# Patient Record
Sex: Male | Born: 1945 | Race: White | Hispanic: No | Marital: Married | Smoking: Former smoker
Health system: Southern US, Community
[De-identification: ages and names within clinical notes are randomized; demographics above are authoritative.]

## PROBLEM LIST (undated history)

## (undated) DIAGNOSIS — C801 Malignant (primary) neoplasm, unspecified: Secondary | ICD-10-CM

## (undated) HISTORY — PX: KNEE SURGERY: SHX244

## (undated) HISTORY — PX: LUNG REMOVAL, PARTIAL: SHX233

## (undated) HISTORY — PX: CHOLECYSTECTOMY: SHX55

---

## 2006-06-05 ENCOUNTER — Ambulatory Visit: Payer: Self-pay | Admitting: Gastroenterology

## 2008-11-02 ENCOUNTER — Ambulatory Visit: Payer: Self-pay | Admitting: Family Medicine

## 2008-11-05 ENCOUNTER — Ambulatory Visit: Payer: Self-pay | Admitting: Oncology

## 2008-11-10 ENCOUNTER — Ambulatory Visit: Payer: Self-pay | Admitting: Oncology

## 2008-11-23 ENCOUNTER — Ambulatory Visit: Payer: Self-pay | Admitting: Oncology

## 2008-11-25 ENCOUNTER — Ambulatory Visit: Payer: Self-pay | Admitting: Oncology

## 2008-12-06 ENCOUNTER — Ambulatory Visit: Payer: Self-pay | Admitting: General Surgery

## 2008-12-08 ENCOUNTER — Inpatient Hospital Stay: Payer: Self-pay | Admitting: General Surgery

## 2008-12-24 ENCOUNTER — Ambulatory Visit: Payer: Self-pay | Admitting: General Surgery

## 2008-12-27 ENCOUNTER — Ambulatory Visit: Payer: Self-pay | Admitting: General Surgery

## 2009-01-11 ENCOUNTER — Ambulatory Visit: Payer: Self-pay | Admitting: General Surgery

## 2009-01-14 ENCOUNTER — Ambulatory Visit: Payer: Self-pay | Admitting: General Surgery

## 2009-01-19 ENCOUNTER — Inpatient Hospital Stay: Payer: Self-pay | Admitting: General Surgery

## 2009-01-26 ENCOUNTER — Ambulatory Visit: Payer: Self-pay | Admitting: Oncology

## 2009-02-08 ENCOUNTER — Ambulatory Visit: Payer: Self-pay | Admitting: General Surgery

## 2009-02-11 ENCOUNTER — Ambulatory Visit: Payer: Self-pay | Admitting: Oncology

## 2009-02-25 ENCOUNTER — Ambulatory Visit: Payer: Self-pay | Admitting: Oncology

## 2009-03-04 ENCOUNTER — Ambulatory Visit: Payer: Self-pay | Admitting: General Surgery

## 2009-05-28 ENCOUNTER — Ambulatory Visit: Payer: Self-pay | Admitting: Oncology

## 2009-06-01 ENCOUNTER — Ambulatory Visit: Payer: Self-pay | Admitting: Oncology

## 2009-06-03 ENCOUNTER — Ambulatory Visit: Payer: Self-pay | Admitting: Oncology

## 2009-06-28 ENCOUNTER — Ambulatory Visit: Payer: Self-pay | Admitting: Oncology

## 2009-08-26 ENCOUNTER — Ambulatory Visit: Payer: Self-pay | Admitting: Oncology

## 2009-08-30 ENCOUNTER — Ambulatory Visit: Payer: Self-pay | Admitting: Oncology

## 2009-09-02 ENCOUNTER — Ambulatory Visit: Payer: Self-pay | Admitting: Oncology

## 2009-09-25 ENCOUNTER — Ambulatory Visit: Payer: Self-pay | Admitting: Oncology

## 2009-11-25 ENCOUNTER — Ambulatory Visit: Payer: Self-pay | Admitting: Oncology

## 2009-12-06 ENCOUNTER — Ambulatory Visit: Payer: Self-pay | Admitting: Oncology

## 2009-12-16 ENCOUNTER — Ambulatory Visit: Payer: Self-pay | Admitting: Oncology

## 2009-12-26 ENCOUNTER — Ambulatory Visit: Payer: Self-pay | Admitting: Oncology

## 2010-06-15 ENCOUNTER — Ambulatory Visit: Payer: Self-pay | Admitting: Oncology

## 2010-06-23 ENCOUNTER — Ambulatory Visit: Payer: Self-pay | Admitting: Oncology

## 2010-06-28 ENCOUNTER — Ambulatory Visit: Payer: Self-pay | Admitting: Oncology

## 2010-12-12 ENCOUNTER — Ambulatory Visit: Payer: Self-pay | Admitting: Oncology

## 2010-12-27 ENCOUNTER — Ambulatory Visit: Payer: Self-pay | Admitting: Oncology

## 2011-01-22 IMAGING — CT NM PET TUM IMG LTD AREA
1 of 5 series · 8 of 25 positions shown · non-contrast
Comparison: none

REASON FOR EXAM: lung nodule seen on ct
COMMENTS:

PROCEDURE:     PET - PET/CT DX LUNG CA  - November 10, 2008  [DATE]
RESULT:     Indication: Lung nodule
Radiopharmaceutical: 13.4 mCi F18-FDG, intravenously.
TECHNIQUE: Imaging was performed from the skull vertex to the mid-thigh
using routine PET/CT acquisition protocol.
Injection site: Left antecubital
Time of FDG injection: 0635 hours
Serum glucose: 81 mg/dL
Time of imaging: 3915 hours
Comparison studies: None

[Series 3: ct wb 3.0 b30f · axial · 3.0mm · 0.98mm/px · z∈[+146,+840]mm · 8 of 435 slices shown]
[im 44/435  soft-tissue]
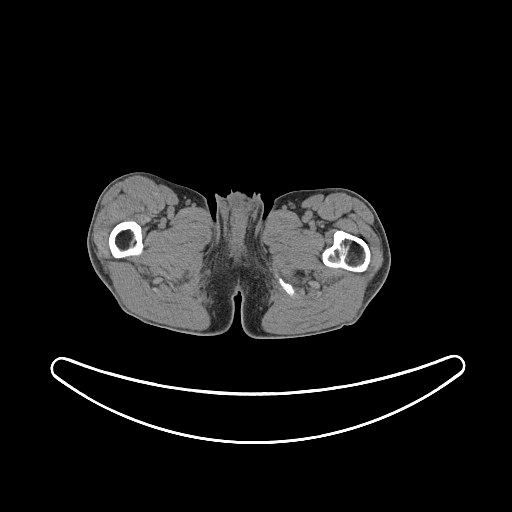
[im 87/435  soft-tissue]
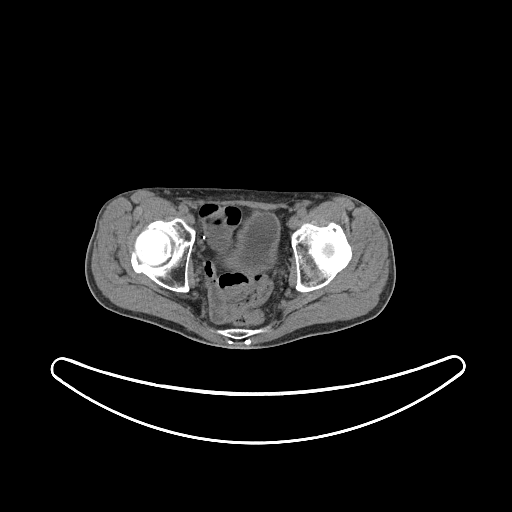
[im 131/435  soft-tissue]
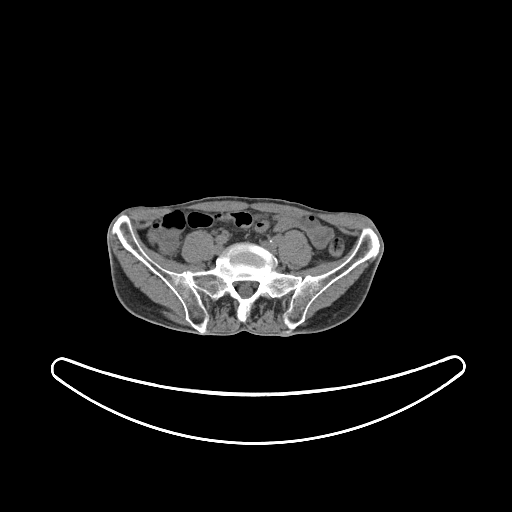
[im 174/435  soft-tissue]
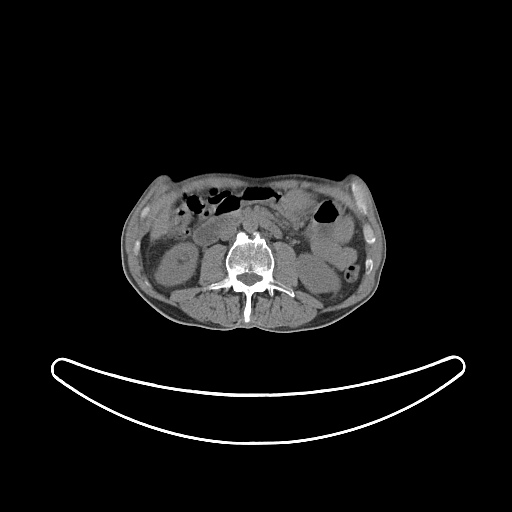
[im 218/435  soft-tissue]
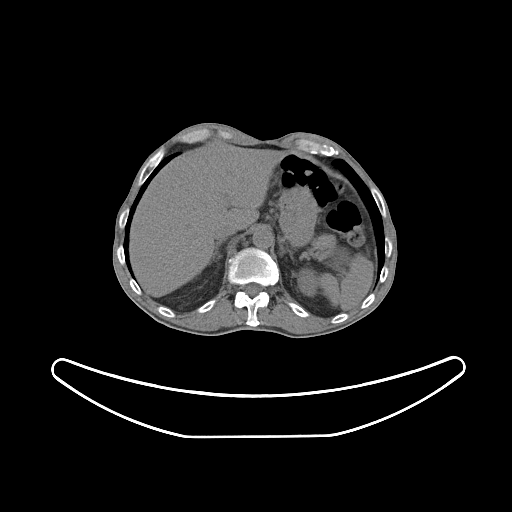
[im 304/435  soft-tissue]
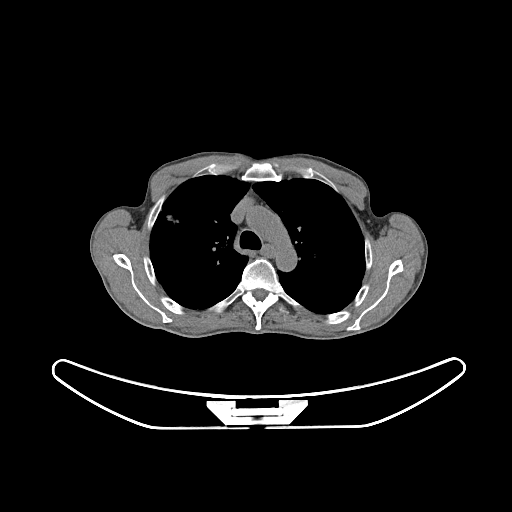
[im 348/435  soft-tissue]
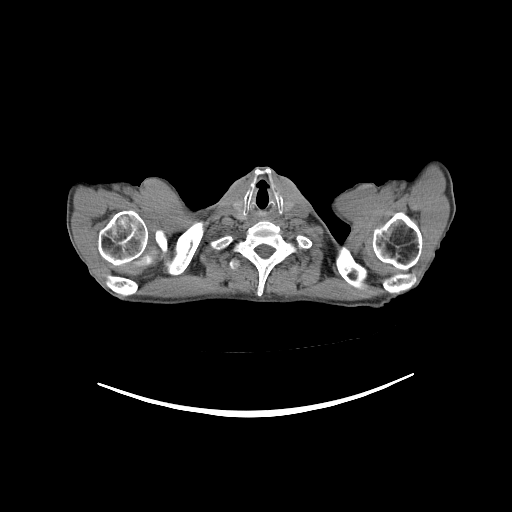
[im 391/435  soft-tissue]
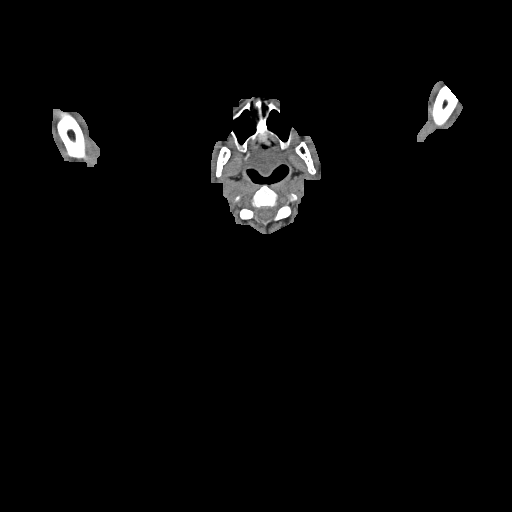

[8 of 25 positions shown; findings below may reference images not displayed]

FINDINGS: HEAD AND NECK:

There is no abnormal hypermetabolic activity in the head and neck. There is
no cervical soft tissue mass or lymphadenopathy.

CHEST:

There is a hypermetabolic spiculated 1.2 x 1.7 cm right upper lobe pulmonary
nodule with adjacent groundglass opacity demonstrating hypermetabolic
activity with an SUV max of 2.2 and an SUV average of 1.9 . There is a
hypermetabolic spiculated pulmonary nodule in the superior segment of the
left lower lobe measuring 1.7 x 1.4 cm with an SUV max of 4.2 and an SUV
average of 3.4 .

There is no pleural effusion or pneumothorax. The heart size is normal.
There is no pericardial effusion. Coronary artery calcifications are noted.

There no pathologically enlarged mediastinal, hilar, or axillary lymph
nodes.

ABDOMEN/PELVIS:

The liver demonstrates no focal abnormality. The gallbladder is surgically
absent. The spleen demonstrates no focal abnormality. The right kidney,
adrenal glands, pancreas are normal. There is a left renal cyst. There is
mild nonspecific right perinephric stranding. The bladder is unremarkable.

The unopacified bowel is unremarkable. There is no pneumoperitoneum,
pneumatosis, or portal venous gas. There is no abdominal or pelvic free
fluid. There is no lymphadenopathy.

The abdominal aorta is normal in caliber.
IMPRESSION: 1. Hypermetabolic spiculated left lower lobe pulmonary nodule. Differential
considerations include malignancy (metastatic versus primary), granulomatous
disease, versus inflammatory process the appearance is concerning for
malignancy until proven otherwise. There is a mildly hypermetabolic left
upper lobe pulmonary nodule which may represent malignancy secondary to
metastatic disease versus synchronous second primary versus an inflammatory
or infectious etiology. Thoracic surgery consultation is recommended.

## 2011-03-10 IMAGING — CR DG CHEST 2V
1 series · 2 of 2 positions shown · non-contrast
Comparison: none

REASON FOR EXAM: lung neoplasma
COMMENTS:

[Series 1: view not recorded · 0.17mm/px · 2 of 2 slices shown]
[im 1/2]
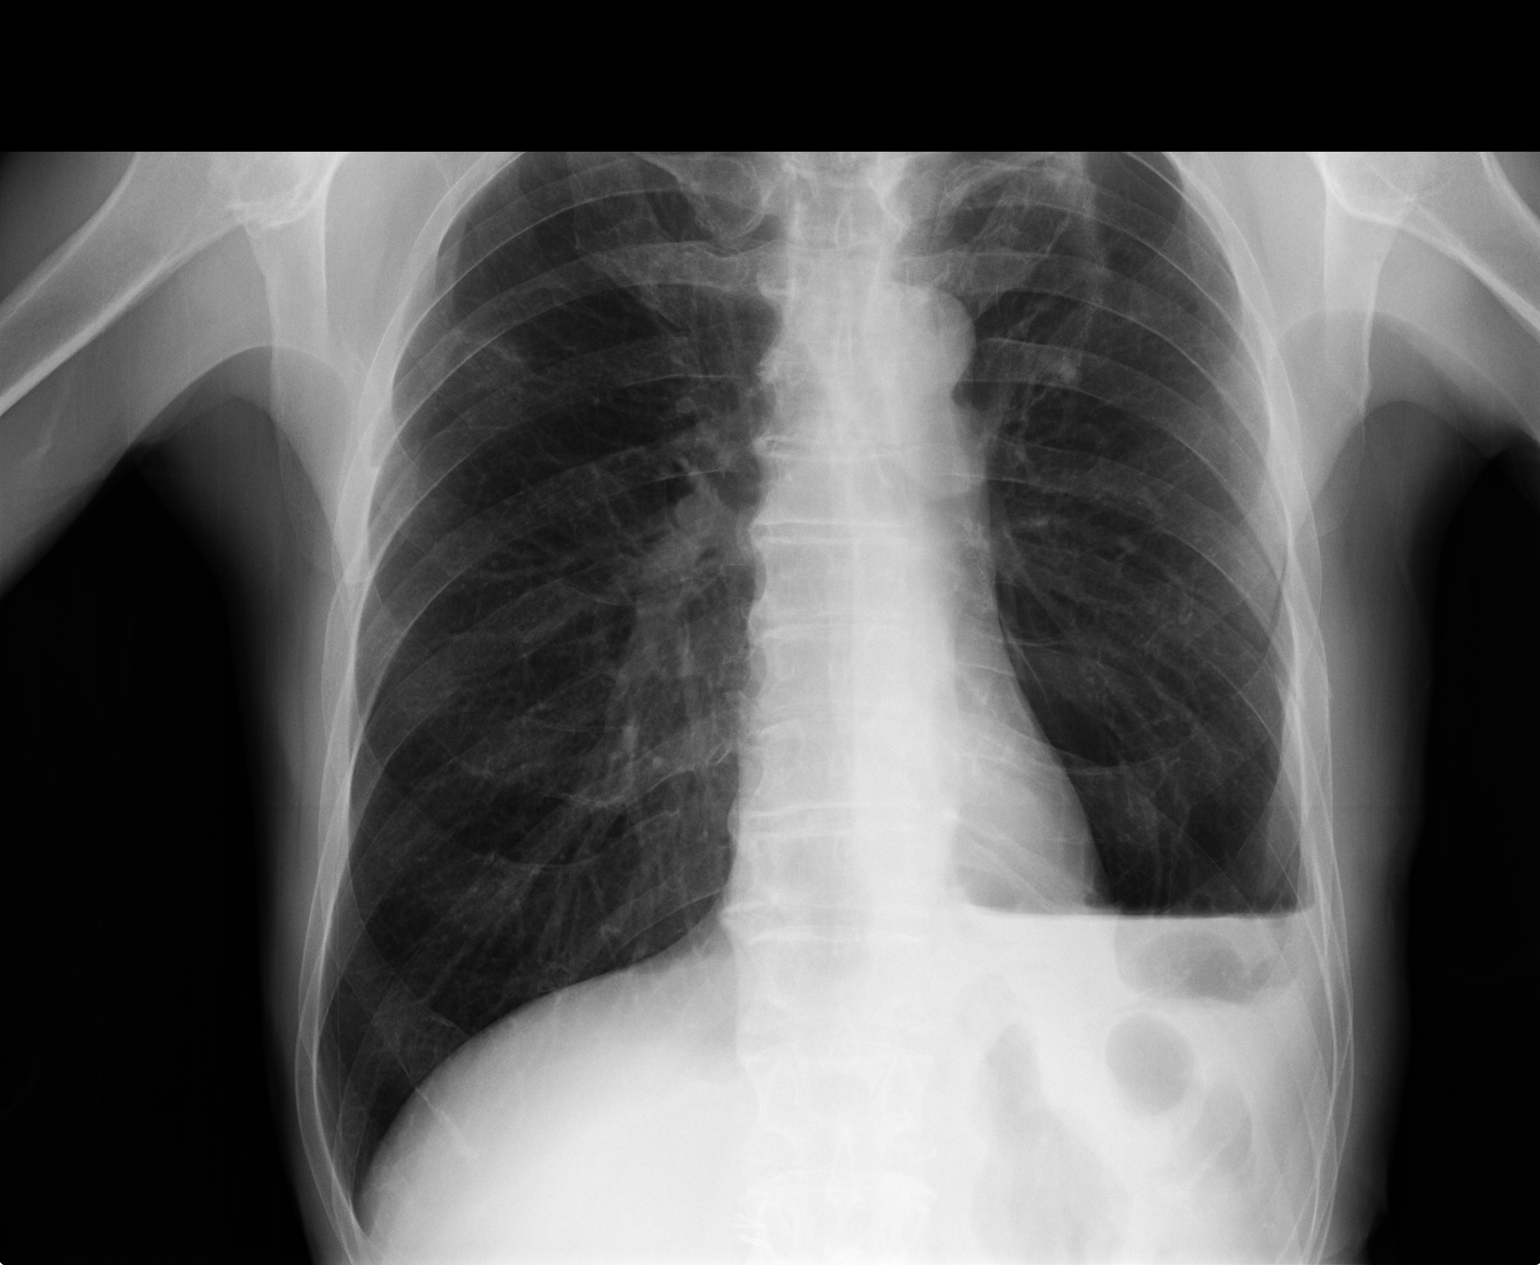
[im 2/2]
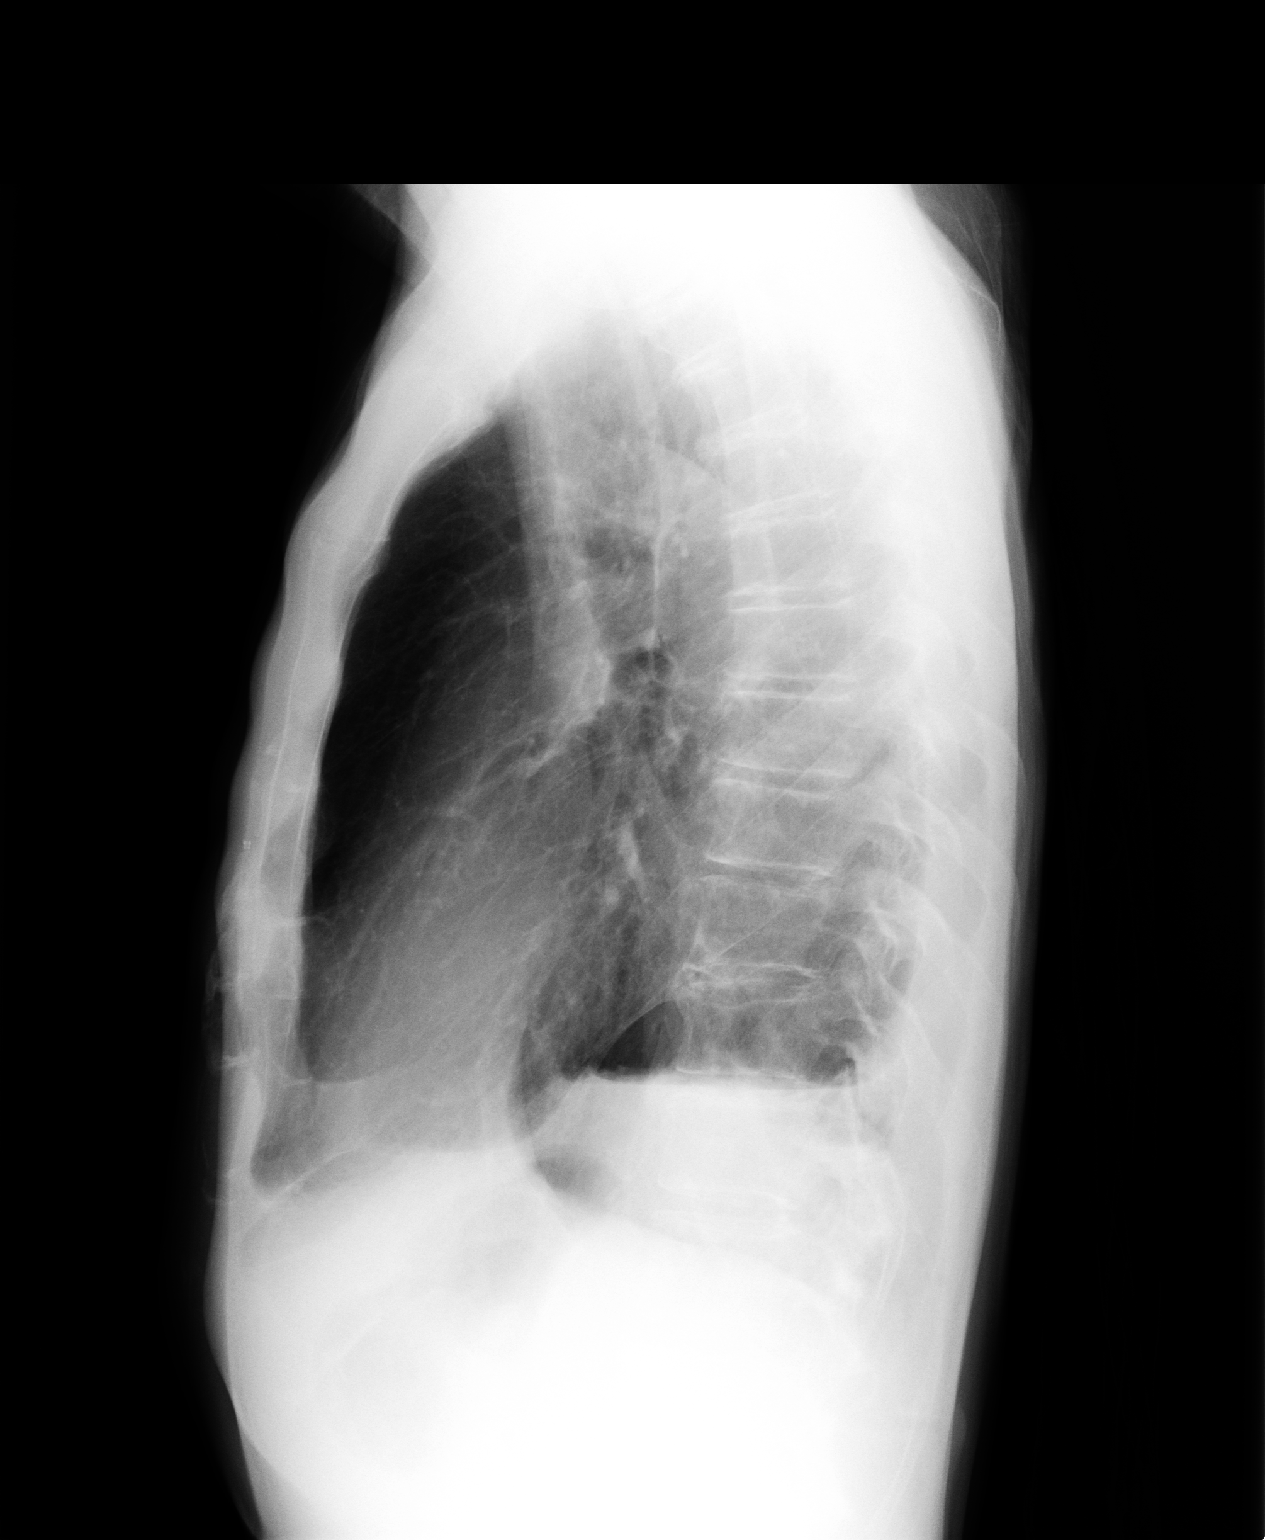

[2 of 2 positions shown; findings below may reference images not displayed]

PROCEDURE:     KDR - KDXR CHEST PA (OR AP) AND LAT  - December 27, 2008  [DATE]

RESULT:     Comparison made to study 24 December, 2008.

The chest tube has been removed. A small apical pneumothorax remains
visible. There is a meniscus consistent with the presence of a
hydropneumothorax. The right lung is well expanded. Stable linear density is
present in the right upper lobe. The cardiac silhouette is normal in size.
IMPRESSION: There is a residual hydropneumothorax on the left following
removal of the chest tube. The volume of fluid does not appear significantly
changed since a study from [DATE]. The apical pleural line is more visible
and likely indicates an approximately 15% pneumothorax.

## 2011-06-26 ENCOUNTER — Ambulatory Visit: Payer: Self-pay | Admitting: Oncology

## 2011-07-04 ENCOUNTER — Ambulatory Visit: Payer: Self-pay | Admitting: Oncology

## 2011-07-27 ENCOUNTER — Ambulatory Visit: Payer: Self-pay | Admitting: Oncology

## 2011-09-26 ENCOUNTER — Ambulatory Visit: Payer: Self-pay | Admitting: Oncology

## 2011-10-03 ENCOUNTER — Ambulatory Visit: Payer: Self-pay | Admitting: Oncology

## 2011-10-27 ENCOUNTER — Ambulatory Visit: Payer: Self-pay | Admitting: Oncology

## 2012-01-27 ENCOUNTER — Ambulatory Visit: Payer: Self-pay | Admitting: Oncology

## 2012-02-04 ENCOUNTER — Ambulatory Visit: Payer: Self-pay | Admitting: Oncology

## 2012-02-04 LAB — CREATININE, SERUM
Creatinine: 0.99 mg/dL (ref 0.60–1.30)
EGFR (African American): >60

## 2012-02-26 ENCOUNTER — Ambulatory Visit: Payer: Self-pay | Admitting: Oncology

## 2012-08-06 ENCOUNTER — Ambulatory Visit: Payer: Self-pay | Admitting: Oncology

## 2012-08-06 LAB — CREATININE, SERUM
Creatinine: 1.02 mg/dL (ref 0.60–1.30)
EGFR (African American): >60
EGFR (Non-African Amer.): >60

## 2012-08-20 ENCOUNTER — Ambulatory Visit: Payer: Self-pay | Admitting: Oncology

## 2012-08-26 ENCOUNTER — Ambulatory Visit: Payer: Self-pay | Admitting: Oncology

## 2013-02-18 ENCOUNTER — Ambulatory Visit: Payer: Self-pay | Admitting: Oncology

## 2013-02-18 LAB — CREATININE, SERUM
EGFR (African American): >60
EGFR (Non-African Amer.): >60

## 2013-02-25 ENCOUNTER — Ambulatory Visit: Payer: Self-pay | Admitting: Oncology

## 2013-03-28 ENCOUNTER — Ambulatory Visit: Payer: Self-pay | Admitting: Oncology

## 2013-08-25 ENCOUNTER — Ambulatory Visit: Payer: Self-pay | Admitting: Oncology

## 2013-08-26 ENCOUNTER — Ambulatory Visit: Payer: Self-pay | Admitting: Oncology

## 2013-09-25 ENCOUNTER — Ambulatory Visit: Payer: Self-pay | Admitting: Oncology

## 2014-03-17 ENCOUNTER — Ambulatory Visit: Payer: Self-pay | Admitting: Oncology

## 2014-03-28 ENCOUNTER — Ambulatory Visit: Payer: Self-pay | Admitting: Oncology

## 2014-08-30 ENCOUNTER — Ambulatory Visit
Admit: 2014-08-30 | Disposition: A | Discharge status: Home / Self Care | Payer: Self-pay | Attending: Unknown Physician Specialty | Admitting: Unknown Physician Specialty

## 2014-12-20 ENCOUNTER — Encounter: Payer: Self-pay | Admitting: *Deleted

## 2014-12-20 ENCOUNTER — Emergency Department: Payer: Medicare Other

## 2014-12-20 ENCOUNTER — Inpatient Hospital Stay
Admission: EM | Admit: 2014-12-20 | Discharge: 2014-12-27 | DRG: 871 | Disposition: A | Discharge status: Home Health | Payer: Medicare Other | Attending: Internal Medicine | Admitting: Internal Medicine

## 2014-12-20 DIAGNOSIS — H409 Unspecified glaucoma: Secondary | ICD-10-CM | POA: Diagnosis present

## 2014-12-20 DIAGNOSIS — E785 Hyperlipidemia, unspecified: Secondary | ICD-10-CM | POA: Diagnosis present

## 2014-12-20 DIAGNOSIS — J189 Pneumonia, unspecified organism: Secondary | ICD-10-CM

## 2014-12-20 DIAGNOSIS — J440 Chronic obstructive pulmonary disease with acute lower respiratory infection: Secondary | ICD-10-CM | POA: Diagnosis not present

## 2014-12-20 DIAGNOSIS — E876 Hypokalemia: Secondary | ICD-10-CM | POA: Diagnosis not present

## 2014-12-20 DIAGNOSIS — A419 Sepsis, unspecified organism: Secondary | ICD-10-CM | POA: Diagnosis present

## 2014-12-20 DIAGNOSIS — R6510 Systemic inflammatory response syndrome (SIRS) of non-infectious origin without acute organ dysfunction: Secondary | ICD-10-CM

## 2014-12-20 DIAGNOSIS — Z87891 Personal history of nicotine dependence: Secondary | ICD-10-CM

## 2014-12-20 DIAGNOSIS — Z23 Encounter for immunization: Secondary | ICD-10-CM

## 2014-12-20 DIAGNOSIS — R29898 Other symptoms and signs involving the musculoskeletal system: Secondary | ICD-10-CM

## 2014-12-20 DIAGNOSIS — E43 Unspecified severe protein-calorie malnutrition: Secondary | ICD-10-CM | POA: Diagnosis not present

## 2014-12-20 DIAGNOSIS — Z85118 Personal history of other malignant neoplasm of bronchus and lung: Secondary | ICD-10-CM

## 2014-12-20 DIAGNOSIS — R0989 Other specified symptoms and signs involving the circulatory and respiratory systems: Secondary | ICD-10-CM

## 2014-12-20 DIAGNOSIS — R0602 Shortness of breath: Secondary | ICD-10-CM

## 2014-12-20 DIAGNOSIS — Z66 Do not resuscitate: Secondary | ICD-10-CM | POA: Diagnosis present

## 2014-12-20 DIAGNOSIS — I959 Hypotension, unspecified: Secondary | ICD-10-CM | POA: Diagnosis not present

## 2014-12-20 DIAGNOSIS — R6521 Severe sepsis with septic shock: Secondary | ICD-10-CM | POA: Diagnosis not present

## 2014-12-20 DIAGNOSIS — R4182 Altered mental status, unspecified: Secondary | ICD-10-CM | POA: Diagnosis not present

## 2014-12-20 DIAGNOSIS — J9601 Acute respiratory failure with hypoxia: Secondary | ICD-10-CM | POA: Diagnosis not present

## 2014-12-20 DIAGNOSIS — Z800 Family history of malignant neoplasm of digestive organs: Secondary | ICD-10-CM | POA: Diagnosis not present

## 2014-12-20 HISTORY — DX: Malignant (primary) neoplasm, unspecified: C80.1

## 2014-12-20 LAB — BASIC METABOLIC PANEL
Anion gap: 7 (ref 5–15)
BUN: 13 mg/dL (ref 6–20)
CO2: 29 mmol/L (ref 22–32)
CREATININE: 0.62 mg/dL (ref 0.61–1.24)
Calcium: 8.4 mg/dL — ABNORMAL LOW (ref 8.9–10.3)
Chloride: 97 mmol/L — ABNORMAL LOW (ref 101–111)
GFR calc Af Amer: >60 mL/min (ref >60)
GFR calc non Af Amer: >60 mL/min (ref >60)
GLUCOSE: 180 mg/dL — AB (ref 65–99)
Potassium: 3.9 mmol/L (ref 3.5–5.1)
Sodium: 133 mmol/L — ABNORMAL LOW (ref 135–145)

## 2014-12-20 LAB — BLOOD GAS, ARTERIAL
Acid-Base Excess: 4 mmol/L — ABNORMAL HIGH (ref 0.0–3.0)
Allens test (pass/fail): POSITIVE — AB
Bicarbonate: 27.6 mEq/L (ref 21.0–28.0)
FIO2: 100
O2 Saturation: 98.8 %
PH ART: 7.44 (ref 7.350–7.450)
Patient temperature: 39.4
pCO2 arterial: 41 mmHg (ref 32.0–48.0)
pO2, Arterial: 133 mmHg — ABNORMAL HIGH (ref 83.0–108.0)

## 2014-12-20 LAB — CBC WITH DIFFERENTIAL/PLATELET
Basophils Absolute: 0 10*3/uL (ref 0–0.1)
Basophils Relative: 0 %
Eosinophils Absolute: 0 10*3/uL (ref 0–0.7)
Eosinophils Relative: 0 %
HCT: 33.4 % — ABNORMAL LOW (ref 40.0–52.0)
Hemoglobin: 11.2 g/dL — ABNORMAL LOW (ref 13.0–18.0)
Lymphocytes Relative: 3 %
Lymphs Abs: 0.4 10*3/uL — ABNORMAL LOW (ref 1.0–3.6)
MCH: 29.1 pg (ref 26.0–34.0)
MCHC: 33.4 g/dL (ref 32.0–36.0)
MCV: 86.9 fL (ref 80.0–100.0)
Monocytes Absolute: 0.4 10*3/uL (ref 0.2–1.0)
Monocytes Relative: 3 %
Neutro Abs: 14.3 10*3/uL — ABNORMAL HIGH (ref 1.4–6.5)
Neutrophils Relative %: 94 %
PLATELETS: 332 10*3/uL (ref 150–440)
RBC: 3.84 MIL/uL — AB (ref 4.40–5.90)
RDW: 14.9 % — ABNORMAL HIGH (ref 11.5–14.5)
WBC: 15.1 10*3/uL — ABNORMAL HIGH (ref 3.8–10.6)

## 2014-12-20 LAB — BRAIN NATRIURETIC PEPTIDE: B Natriuretic Peptide: 57 pg/mL (ref 0.0–100.0)

## 2014-12-20 LAB — TROPONIN I: Troponin I: <0.03 ng/mL (ref <0.031)

## 2014-12-20 LAB — MRSA PCR SCREENING: MRSA by PCR: NEGATIVE

## 2014-12-20 LAB — GLUCOSE, CAPILLARY: Glucose-Capillary: 188 mg/dL — ABNORMAL HIGH (ref 65–99)

## 2014-12-20 LAB — LACTIC ACID, PLASMA
LACTIC ACID, VENOUS: 2 mmol/L (ref 0.5–2.0)
LACTIC ACID, VENOUS: 1.8 mmol/L (ref 0.5–2.0)

## 2014-12-20 MED ORDER — IPRATROPIUM-ALBUTEROL 0.5-2.5 (3) MG/3ML IN SOLN
3.0000 mL | Freq: Four times a day (QID) | RESPIRATORY_TRACT | Status: DC
  Administered 2014-12-21: 3 mL via RESPIRATORY_TRACT
  Filled 2014-12-20: qty 3

## 2014-12-20 MED ORDER — PIPERACILLIN-TAZOBACTAM 3.375 G IVPB
3.3750 g | Freq: Once | INTRAVENOUS | Status: AC
  Administered 2014-12-20: 3.375 g via INTRAVENOUS
  Filled 2014-12-20: qty 50

## 2014-12-20 MED ORDER — SODIUM CHLORIDE 0.9 % IV SOLN
Freq: Once | INTRAVENOUS | Status: AC
  Administered 2014-12-20: 20:00:00 via INTRAVENOUS

## 2014-12-20 MED ORDER — HEPARIN SODIUM (PORCINE) 5000 UNIT/ML IJ SOLN
5000.0000 [IU] | Freq: Three times a day (TID) | INTRAMUSCULAR | Status: DC
Start: 2014-12-20 — End: 2014-12-27
  Administered 2014-12-20 – 2014-12-27 (×20): 5000 [IU] via SUBCUTANEOUS
  Filled 2014-12-20 (×19): qty 1

## 2014-12-20 MED ORDER — CEFTRIAXONE SODIUM IN DEXTROSE 20 MG/ML IV SOLN: 1.0000 g | INTRAVENOUS | Status: DC

## 2014-12-20 MED ORDER — NOREPINEPHRINE 4 MG/250ML-% IV SOLN: 0.0000 ug/min | INTRAVENOUS | Status: DC

## 2014-12-20 MED ORDER — PIPERACILLIN-TAZOBACTAM 4.5 G IVPB
4.5000 g | Freq: Three times a day (TID) | INTRAVENOUS | Status: DC
  Administered 2014-12-20: 4.5 g via INTRAVENOUS
  Filled 2014-12-20 (×3): qty 100

## 2014-12-20 MED ORDER — DEXTROSE 5 % IV SOLN
1.0000 g | INTRAVENOUS | Status: DC
  Administered 2014-12-20: 1 g via INTRAVENOUS
  Filled 2014-12-20 (×2): qty 10

## 2014-12-20 MED ORDER — IPRATROPIUM-ALBUTEROL 0.5-2.5 (3) MG/3ML IN SOLN
3.0000 mL | Freq: Once | RESPIRATORY_TRACT | Status: AC
  Administered 2014-12-20: 3 mL via RESPIRATORY_TRACT
  Filled 2014-12-20: qty 3

## 2014-12-20 MED ORDER — SODIUM CHLORIDE 0.9 % IV SOLN
INTRAVENOUS | Status: DC
  Administered 2014-12-20 – 2014-12-22 (×5): via INTRAVENOUS

## 2014-12-20 MED ORDER — DEXTROSE 5 % IV SOLN
5.0000 ug/min | INTRAVENOUS | Status: DC
  Filled 2014-12-20: qty 4

## 2014-12-20 MED ORDER — VANCOMYCIN HCL IN DEXTROSE 1-5 GM/200ML-% IV SOLN
1000.0000 mg | Freq: Once | INTRAVENOUS | Status: AC
  Administered 2014-12-20: 1000 mg via INTRAVENOUS
  Filled 2014-12-20: qty 200

## 2014-12-20 MED ORDER — SODIUM CHLORIDE 0.9 % IV SOLN
Freq: Once | INTRAVENOUS | Status: AC
  Administered 2014-12-20: 19:00:00 via INTRAVENOUS

## 2014-12-20 MED ORDER — NOREPINEPHRINE BITARTRATE 1 MG/ML IV SOLN: 5.0000 ug/min | INTRAVENOUS | Status: DC

## 2014-12-20 MED ORDER — METHYLPREDNISOLONE SODIUM SUCC 125 MG IJ SOLR
125.0000 mg | Freq: Once | INTRAMUSCULAR | Status: AC
  Administered 2014-12-20: 125 mg via INTRAVENOUS
  Filled 2014-12-20: qty 2

## 2014-12-20 MED ORDER — VANCOMYCIN HCL 500 MG IV SOLR
500.0000 mg | INTRAVENOUS | Status: DC
  Filled 2014-12-20 (×2): qty 500

## 2014-12-20 MED ORDER — DEXTROSE 5 % IV SOLN
500.0000 mg | INTRAVENOUS | Status: DC
  Administered 2014-12-20 – 2014-12-24 (×5): 500 mg via INTRAVENOUS
  Filled 2014-12-20 (×6): qty 500

## 2014-12-20 NOTE — ED Notes (Signed)
Patient's wife, daughter, minister and other family at bedside.

## 2014-12-20 NOTE — H&P (Signed)
Stephen Pollard at [place] NAME: Stephen Pollard    MR#:  [id number]  DATE OF BIRTH:  [birth year]  DATE OF ADMISSION:  12/20/2014  PRIMARY CARE PHYSICIAN: Stephen Daisy, MD   REQUESTING/REFERRING PHYSICIAN: Lenise Pollard, M.D.  CHIEF COMPLAINT:   Chief Complaint  Patient presents with  . Shortness of Breath   cough, fever  HISTORY OF PRESENT ILLNESS:  Stephen Pollard  is a 69 y.o. male with a known history of COPD and stage I lung cancer first diagnosed in 2010 status post partial lung resection followed by Stephen Pollard is being admitted for sepsis likely due to pneumonia.  Patient has been having intermittent fever with cough and shortness of breath for last 3 weeks.  Has been treated by his primary care physician with Biaxin XL 1000 mg daily for 2 weeks without much relief.  Patient was seen by his primary care physician last week and was given Tessalon Perles and no antibiotics.  Patient reports continued cough but no fever.  On 6 PM today he got extremely short of breath with constant coughing and could not manage at home.  Called EMS and was brought down to the emergency department.  While in the ED he was noted to have pneumonia on chest x-ray and criteria meeting sepsis based on vitals.  He spiked a temperature up to 103.5.  While in the emergency department and his heart rate was running anywhere from 110 to 140s with a respiratory rate anywhere from 24-30s.  Also reports gradual weight loss of about 6-8 pounds over last 6-8 months 2 pounds in last 2 weeks.  All family is at bedside.  He denies any hemoptysis.  He was released from [facility] last October after serial CT scans and follow-ups and was declared cancer free per patient. PAST MEDICAL HISTORY:   Past Medical History  Diagnosis Date  . Cancer     Lung Cancer  . COPD (chronic obstructive pulmonary disease)  . Hyperlipidemia  . Bladder neck obstruction  . Lung cancer  Stage  1A  . Gallstones  . Glaucoma - Mild  PAST SURGICAL HISTORY:   Past Surgical History  Procedure Laterality Date  . Lung removal, partial Bilateral   . Cholecystectomy    . Knee surgery Left   . Thoracoscopy bilateral with lobectomy lung Bilateral 2010  RUL & LLL  . Vasectomy 1977  . Colonoscopy 06/05/2006  Adenomatous Polyps  . Colonoscopy 08/30/2014  PH Adenomatous Polyps, FH Colon Polyps (Father/Brother): [facility] 08/2019  SOCIAL HISTORY:   History  Substance Use Topics  . Smoking status: Former Research scientist (life sciences)  . Smokeless tobacco: Not on file  . Alcohol Use: Yes     Comment: occasionally  Occupational History  . 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 Pancreatic cancer Brother  . Colon polyps Other  DRUG ALLERGIES:  No Known Allergies REVIEW OF SYSTEMS:  Review of Systems  Constitutional: Positive for fever, weight loss (about 8 pounds since last 8 months, unintentional) and malaise/fatigue. Negative for diaphoresis.  HENT: Negative for ear discharge, ear pain, hearing loss, nosebleeds, sore throat and tinnitus.   Eyes: Negative for blurred vision and pain.  Respiratory: Positive for cough, sputum production and shortness of breath. Negative for hemoptysis and wheezing.   Cardiovascular: Negative for chest pain, palpitations, orthopnea and leg swelling.  Gastrointestinal: Positive for abdominal pain (lower abdomen from constant coughing). Negative for  heartburn, nausea, vomiting, diarrhea, constipation and blood in stool.  Genitourinary: Negative for dysuria, urgency and frequency.  Musculoskeletal: Negative for myalgias and back pain.  Skin: Negative for itching and rash.  Neurological: Positive for weakness. Negative for dizziness, tingling, tremors, focal weakness, seizures and headaches.  Psychiatric/Behavioral: Negative for depression. The patient is not nervous/anxious.    MEDICATIONS AT HOME:   Prior to Admission  medications   Medication Sig Start Date End Date Taking? Authorizing Provider  benzonatate (TESSALON) 100 MG capsule Take 100 mg by mouth 3 (three) times daily as needed for cough.   Yes Historical Provider, MD  cetirizine (ZYRTEC) 10 MG tablet Take 10 mg by mouth at bedtime. Pt took two tablets this morning.  (12/20/14)   Yes Historical Provider, MD  guaiFENesin-codeine (ROBITUSSIN AC) 100-10 MG/5ML syrup Take 5 mLs by mouth at bedtime as needed for cough.    Yes Historical Provider, MD  rosuvastatin (CRESTOR) 5 MG tablet Take 5 mg by mouth at bedtime.    Yes Historical Provider, MD  timolol (BETIMOL) 0.5 % ophthalmic solution Place 1 drop into both eyes at bedtime.   Yes Historical Provider, MD   VITAL SIGNS:  Blood pressure 114/69, pulse 113, temperature 98.9 F (37.2 C), temperature source Axillary, resp. rate 24, height '5\' 3"'$  (1.6 m), weight 40.053 kg (88 lb 4.8 oz), SpO2 100 %. PHYSICAL EXAMINATION:  Physical Exam  Constitutional: He is oriented to person, place, and time. He appears malnourished and dehydrated. He appears unhealthy. He appears cachectic. He appears toxic. He has a sickly appearance. He appears distressed.  HENT:  Head: Normocephalic and atraumatic.  Eyes: Conjunctivae and EOM are normal. Pupils are equal, round, and reactive to light.  Neck: Normal range of motion. Neck supple. No tracheal deviation present. No thyromegaly present.  Cardiovascular: Normal rate, regular rhythm and normal heart sounds.   Pulmonary/Chest: Effort normal and breath sounds normal. No respiratory distress. He has no wheezes. He exhibits no tenderness.  Abdominal: Soft. Bowel sounds are normal. He exhibits no distension. There is no tenderness.  Musculoskeletal: Normal range of motion.  Neurological: He is alert and oriented to person, place, and time. No cranial nerve deficit.  Skin: Skin is warm and dry. No rash noted.  Psychiatric: Mood and affect normal.   LABORATORY PANEL:    CBC  Recent Labs Lab 12/20/14 1915  WBC 15.1*  HGB 11.2*  HCT 33.4*  PLT 332   ------------------------------------------------------------------------------------------------------------------  Chemistries   Recent Labs Lab 12/20/14 1915  NA 133*  K 3.9  CL 97*  CO2 29  GLUCOSE 180*  BUN 13  CREATININE 0.62  CALCIUM 8.4*   RADIOLOGY:  Dg Chest 1 View  12/20/2014   CLINICAL DATA:  Lung cancer 6 years ago. Cough and weakness over the past 2 weeks. On home oxygen. Lower chest pain and nonproductive cough.  EXAM: CHEST  1 VIEW  COMPARISON:  Chest x-ray 03/04/2009 and chest CT 02/18/2013  FINDINGS: There are postsurgical changes of the left hemi thorax. Volume loss of the left lung with mild cardiomediastinal shift to the left unchanged from the previous CT. There is patchy opacification throughout the left lung which is worse as cannot exclude acute infectious process and less likely recurrent neoplastic disease. Possible small amount left pleural fluid. Right lung is clear. Remainder of the exam is unchanged.  IMPRESSION: Postsurgical volume loss of the left lung with mild cardiomediastinal shift to the left unchanged. Mild worsening patchy opacification throughout the left lung  with possible small effusion. Findings may be due to infection, although cannot completely exclude recurrent neoplastic disease. Recommend chest CT with contrast.   Electronically Signed   By: Marin Olp M.D.   On: 01/03/2015 19:40   IMPRESSION AND PLAN:  69 y.o. male with a known history of COPD and stage I lung cancer being admitted for sepsis  * Sepsis: With fever, leukocytosis, tachycardia, tachypnea - likely source being lung.  Confirmed on chest x-ray.  Will monitor in ICU as stepdown.  Pressors as needed. initiate sepsis protocol.  Blood and sputum culture.  * Pneumonia: We will treat him with IV Rocephin, Zithromax and vancomycin, consult pulmonary.  Get a CT scan of the chest in the  morning.  * History of lung cancer, stage I: Followed by Stephen Pollard, patient and family claims that he is cancer free. at this point we will repeat a CT chest for further evaluation.  Consider oncology consult based on the CT findings  * COPD: Seem to be chronic, he is not actively wheezing.  We will hold off steroids at this point. we will start him on nebulizer treatment  All the records are reviewed and case discussed with ED provider. Management plans discussed with the patient, family and they are in agreement.  CODE STATUS: DO NOT RESUSCITATE  TOTAL TIME (CRITICAL CARE) TAKING CARE OF THIS PATIENT: 55 minutes.   He remains at very high risk for cardio respiratory failure, multiorgan failure and death.  [facility], [place] M.D on 01-03-2015 at 8:54 PM  Between 7am to 6pm - Pager - [phone]  After 6pm go to [email or web address] - password [email or web address] [facility]  [facility]  Office  [phone]  CC: Primary care physician; No primary care provider on file.

## 2014-12-20 NOTE — ED Notes (Signed)
Per EMS report, patient was in the mid-80's for oxygen saturation upon first -responder's arrival. Patient had Lung CA 6 years ago and was released last year by oncologist. Patient has had a cough and feeling of malaise for two weeks and was on ABX and was taken off last week and given cough med instead. Patient has a bilateral partial lung removal 6 years ago. Patient is not on home O2. Since two days ago, patient has had increased dyspnea that worsens with ambulation. Per daughter's report, patient does not want to be intubated, but otherwise is a full-code.

## 2014-12-20 NOTE — ED Notes (Signed)
Length of infusion per Dr. Jimmye Norman

## 2014-12-20 NOTE — ED Provider Notes (Signed)
[facility] Emergency Department Provider Note     Time seen: ----------------------------------------- 6:37 PM on 12/20/2014 -----------------------------------------    I have reviewed the triage vital signs and the nursing notes.   HISTORY  Chief Complaint Shortness of Breath    HPI Stephen Pollard is a 69 y.o. male with a history of treated lung cancer who presents with severe dyspnea today. According to report his had shortness of breath and cough that has been worsening over the past several days. He has taken an outpatient course of oral antibiotics without any improvement. Daughter states that he had a worse cough on Saturday, dyspnea has worsened since then. Denies any fevers or chills, denies any recent changes in his medications.Room air sats were in the 80s, he does not typically wear oxygen   No past medical history on file.  There are no active problems to display for this patient.   No past surgical history on file.  Allergies Review of patient's allergies indicates not on file.  Social History History  Substance Use Topics  . Smoking status: Not on file  . Smokeless tobacco: Not on file  . Alcohol Use: Not on file    Review of Systems Constitutional: Negative for fever. Eyes: Negative for visual changes. ENT: Negative for sore throat. Cardiovascular: Negative for chest pain. Respiratory: Positive shortness breath and cough Gastrointestinal: Negative for abdominal pain, vomiting and diarrhea. Genitourinary: Negative for dysuria. Musculoskeletal: Negative for back pain. Skin: Negative for rash. Neurological: Negative for headaches, focal weakness or numbness.  10-point ROS otherwise negative.  ____________________________________________   PHYSICAL EXAM:  VITAL SIGNS: ED Triage Vitals  Enc Vitals Group     BP --      Pulse --      Resp --      Temp --      Temp src --      SpO2 --      Weight --    Height --      Head Cir --      Peak Flow --      Pain Score --      Pain Loc --      Pain Edu? --      Excl. in [place]? --     Constitutional: Alert, cachectic, moderate distress Eyes: Conjunctivae are normal. PERRL. Normal extraocular movements. ENT   Head: Normocephalic and atraumatic.   Nose: No congestion/rhinnorhea.   Mouth/Throat: Mucous membranes are moist.   Neck: No stridor. Cardiovascular: Normal rate, regular rhythm. Normal and symmetric distal pulses are present in all extremities. No murmurs, rubs, or gallops. Respiratory: Tachypnea, diminished breath sounds, wheezing bilaterally. Retractions are present. Gastrointestinal: Soft and nontender. No distention. No abdominal bruits.  Musculoskeletal: Nontender with normal range of motion in all extremities. No joint effusions.  No lower extremity tenderness nor edema. Neurologic:  Normal speech and language. No gross focal neurologic deficits are appreciated.  Skin:  Skin is warm, dry and intact. No rash noted. Psychiatric: Mood and affect are normal. Speech and behavior are normal.  ____________________________________________  EKG: Interpreted by me. Sinus tachycardia with a rate of 136 bpm, rightward axis, pulmonary disease pattern, nonspecific T wave changes, no evidence of acute infarction.  ____________________________________________  ED COURSE:  Pertinent labs & imaging results that were available during my care of the patient were reviewed by me and considered in my medical decision making (see chart for details). Patient with significant dyspnea, DuoNeb since steroids will be ordered. We'll  also check ABG and chest x-ray. ____________________________________________    LABS (pertinent positives/negatives)  Labs Reviewed  CBC WITH DIFFERENTIAL/PLATELET - Abnormal; Notable for the following:    WBC 15.1 (*)    RBC 3.84 (*)    Hemoglobin 11.2 (*)    HCT 33.4 (*)    RDW 14.9 (*)    Neutro Abs 14.3  (*)    Lymphs Abs 0.4 (*)    All other components within normal limits  BASIC METABOLIC PANEL - Abnormal; Notable for the following:    Sodium 133 (*)    Chloride 97 (*)    Glucose, Bld 180 (*)    Calcium 8.4 (*)    All other components within normal limits  BLOOD GAS, ARTERIAL - Abnormal; Notable for the following:    pO2, Arterial 133 (*)    Acid-Base Excess 4.0 (*)    Allens test (pass/fail) POSITIVE (*)    All other components within normal limits  CULTURE, BLOOD (ROUTINE X 2)  CULTURE, BLOOD (ROUTINE X 2)  URINE CULTURE  TROPONIN I  LACTIC ACID, PLASMA  BRAIN NATRIURETIC PEPTIDE  LACTIC ACID, PLASMA  URINALYSIS COMPLETEWITH MICROSCOPIC ([facility] ONLY)    RADIOLOGY Images were viewed by me  Chest x-ray reveals chronic postsurgical volume loss, patchy opacification throughout the left lung with possible small effusion likely pneumonia.  CRITICAL CARE Performed by: Earleen Newport   Total critical care time: 30 minutes  Critical care time was exclusive of separately billable procedures and treating other patients.  Critical care was necessary to treat or prevent imminent or life-threatening deterioration.  Critical care was time spent personally by me on the following activities: development of treatment plan with patient and/or surrogate as well as nursing, discussions with consultants, evaluation of patient's response to treatment, examination of patient, obtaining history from patient or surrogate, ordering and performing treatments and interventions, ordering and review of laboratory studies, ordering and review of radiographic studies, pulse oximetry and re-evaluation of patient's condition.  ____________________________________________  FINAL ASSESSMENT AND PLAN  Dyspnea, pneumonia, systemic inflammatory response syndrome  Plan: Patient with labs and imaging as dictated above. Patient with serves or early sepsis. He appears to likely have a pneumonia on his  x-ray but will need CT imaging at some point. His been given back and Zosyn here as well as had cultures drawn. He is currently improving heart rate is 117 down from 136 bpm, blood pressure stable at this time. Patient's been switched from a nonrebreather to 5 L nasal cannula oxygen. Patient states currently feeling better, we'll certainly need admission until he is improved   Earleen Newport, MD   Earleen Newport, MD 12/20/14 2004

## 2014-12-20 NOTE — ED Notes (Signed)
Patient unable to void at this time

## 2014-12-20 NOTE — ED Notes (Signed)
Patient switched from NRB to 5L O2 via [place]. Patient is maintaining O2 sats at 100%.

## 2014-12-20 NOTE — Progress Notes (Addendum)
ANTIBIOTIC CONSULT NOTE - INITIAL  Pharmacy Consult for Azithromycin/Ceftriaxone/Vancomycin Indication: PNA  No Known Allergies  Patient Measurements: Height: '5\' 3"'$  (160 cm) Weight: 88 lb 4.8 oz (40.053 kg) IBW/kg (Calculated) : 56.9 Adjusted Body Weight: 40.3 kg  Vital Signs: Temp: 98.9 F (37.2 C) (07/25 2018) Temp Source: Axillary (07/25 2018) BP: 114/69 mmHg (07/25 2018) Pulse Rate: 113 (07/25 2018) Intake/Output from previous day:   Intake/Output from this shift:    Labs:  Recent Labs  12/20/14 1915  WBC 15.1*  HGB 11.2*  PLT 332  CREATININE 0.62   Estimated Creatinine Clearance: 48.7 mL/min (by C-G formula based on Cr of 0.62). No results for input(s): VANCOTROUGH, VANCOPEAK, VANCORANDOM, GENTTROUGH, GENTPEAK, GENTRANDOM, TOBRATROUGH, TOBRAPEAK, TOBRARND, AMIKACINPEAK, AMIKACINTROU, AMIKACIN in the last 72 hours.   Microbiology: No results found for this or any previous visit (from the past 720 hour(s)).  Medical History: Past Medical History  Diagnosis Date  . Cancer     Lung Cancer    Medications:  Prescriptions prior to admission  Medication Sig Dispense Refill Last Dose  . benzonatate (TESSALON) 100 MG capsule Take 100 mg by mouth 3 (three) times daily as needed for cough.   12/20/2014 at Unknown time  . cetirizine (ZYRTEC) 10 MG tablet Take 10 mg by mouth at bedtime. Pt took two tablets this morning.  (12/20/14)   12/20/2014 at Unknown time  . guaiFENesin-codeine (ROBITUSSIN AC) 100-10 MG/5ML syrup Take 5 mLs by mouth at bedtime as needed for cough.    12/19/2014 at Unknown time  . rosuvastatin (CRESTOR) 5 MG tablet Take 5 mg by mouth at bedtime.    12/19/2014 at Unknown time  . timolol (BETIMOL) 0.5 % ophthalmic solution Place 1 drop into both eyes at bedtime.   12/19/2014 at Unknown time   Assessment:  CrCl = 48.7 ml/min Ke = 0.045 hr-1 T1/2 = 15.4 hr Vd = 28.7 hr   Goal of Therapy:  Vancomycin trough level 15-20 mcg/ml  Plan:  Ceftriaxone 1  gm IV Q24H currently ordered.  No dose adjustment needed.  Azithromycin 500 mg IV Q24H currently ordered.  No dose adjustment needed.    Vancomycin 1 gm IV X 1 given on 7/25 @ 20:00. Vancomycin 500 mg IV Q18H ordered to start 7/26 @ 18:00, ~ 22 hrs after 1st dose (stacked dosing). This pt will reach [facility] by 7/28 @ 20:00. Will draw 1st trough on 7/29 @ 23:30, which will be at C[facility].   Jesselee Poth D 12/20/2014,10:09 PM

## 2014-12-21 ENCOUNTER — Inpatient Hospital Stay: Payer: Medicare Other

## 2014-12-21 LAB — URINALYSIS COMPLETE WITH MICROSCOPIC (ARMC ONLY)
BILIRUBIN URINE: NEGATIVE
Bacteria, UA: NONE SEEN
Glucose, UA: NEGATIVE mg/dL
Ketones, ur: NEGATIVE mg/dL
LEUKOCYTES UA: NEGATIVE
Nitrite: NEGATIVE
Protein, ur: NEGATIVE mg/dL
Specific Gravity, Urine: 1.01 (ref 1.005–1.030)
pH: 5 (ref 5.0–8.0)

## 2014-12-21 LAB — CBC
HCT: 28.7 % — ABNORMAL LOW (ref 40.0–52.0)
Hemoglobin: 9.5 g/dL — ABNORMAL LOW (ref 13.0–18.0)
MCH: 28.8 pg (ref 26.0–34.0)
MCHC: 33.2 g/dL (ref 32.0–36.0)
MCV: 86.8 fL (ref 80.0–100.0)
Platelets: 263 10*3/uL (ref 150–440)
RBC: 3.3 MIL/uL — AB (ref 4.40–5.90)
RDW: 14.8 % — ABNORMAL HIGH (ref 11.5–14.5)
WBC: 17 10*3/uL — AB (ref 3.8–10.6)

## 2014-12-21 LAB — CREATININE, SERUM
Creatinine, Ser: 0.51 mg/dL — ABNORMAL LOW (ref 0.61–1.24)
GFR calc Af Amer: >60 mL/min (ref >60)

## 2014-12-21 MED ORDER — IOHEXOL 300 MG/ML  SOLN
75.0000 mL | Freq: Once | INTRAMUSCULAR | Status: AC | PRN
  Administered 2014-12-21: 75 mL via INTRAVENOUS

## 2014-12-21 MED ORDER — MOMETASONE FURO-FORMOTEROL FUM 200-5 MCG/ACT IN AERO
2.0000 | INHALATION_SPRAY | Freq: Two times a day (BID) | RESPIRATORY_TRACT | Status: DC
  Administered 2014-12-21 – 2014-12-27 (×13): 2 via RESPIRATORY_TRACT
  Filled 2014-12-21: qty 8.8

## 2014-12-21 MED ORDER — VANCOMYCIN HCL 500 MG IV SOLR
500.0000 mg | INTRAVENOUS | Status: DC
  Administered 2014-12-21 – 2014-12-23 (×4): 500 mg via INTRAVENOUS
  Filled 2014-12-21 (×6): qty 500

## 2014-12-21 MED ORDER — SODIUM CHLORIDE 0.9 % IV BOLUS (SEPSIS)
1000.0000 mL | Freq: Once | INTRAVENOUS | Status: AC
  Administered 2014-12-21: 1000 mL via INTRAVENOUS

## 2014-12-21 MED ORDER — METHYLPREDNISOLONE SODIUM SUCC 40 MG IJ SOLR
40.0000 mg | INTRAMUSCULAR | Status: DC
  Administered 2014-12-21 – 2014-12-27 (×7): 40 mg via INTRAVENOUS
  Filled 2014-12-21 (×7): qty 1

## 2014-12-21 MED ORDER — FAMOTIDINE IN NACL 20-0.9 MG/50ML-% IV SOLN
20.0000 mg | Freq: Two times a day (BID) | INTRAVENOUS | Status: DC
  Administered 2014-12-21 – 2014-12-26 (×11): 20 mg via INTRAVENOUS
  Filled 2014-12-21 (×13): qty 50

## 2014-12-21 MED ORDER — PIPERACILLIN-TAZOBACTAM 3.375 G IVPB
3.3750 g | Freq: Three times a day (TID) | INTRAVENOUS | Status: DC
  Administered 2014-12-21 – 2014-12-25 (×12): 3.375 g via INTRAVENOUS
  Filled 2014-12-21 (×16): qty 50

## 2014-12-21 MED ORDER — VANCOMYCIN HCL IN DEXTROSE 1-5 GM/200ML-% IV SOLN: 1000.0000 mg | INTRAVENOUS | Status: DC

## 2014-12-21 MED ORDER — TIOTROPIUM BROMIDE MONOHYDRATE 18 MCG IN CAPS
18.0000 ug | ORAL_CAPSULE | Freq: Every day | RESPIRATORY_TRACT | Status: DC
  Administered 2014-12-21 – 2014-12-27 (×7): 18 ug via RESPIRATORY_TRACT
  Filled 2014-12-21 (×3): qty 5

## 2014-12-21 MED ORDER — BUDESONIDE 0.25 MG/2ML IN SUSP
0.2500 mg | Freq: Two times a day (BID) | RESPIRATORY_TRACT | Status: DC
  Administered 2014-12-21 – 2014-12-27 (×11): 0.25 mg via RESPIRATORY_TRACT
  Filled 2014-12-21 (×13): qty 2

## 2014-12-21 MED ORDER — IPRATROPIUM-ALBUTEROL 0.5-2.5 (3) MG/3ML IN SOLN
3.0000 mL | RESPIRATORY_TRACT | Status: DC
  Administered 2014-12-21 – 2014-12-22 (×8): 3 mL via RESPIRATORY_TRACT
  Filled 2014-12-21 (×10): qty 3

## 2014-12-21 NOTE — Progress Notes (Signed)
Initial Nutrition Assessment  DOCUMENTATION CODES:   Severe malnutrition in context of chronic illness  INTERVENTION:   Meals and Snacks: Cater to patient preferences Medical Food Supplement Therapy: pt does not really like the taste of Ensure type supplements; pt does like milkshakes; agreeable to trying milkshake made with [employer] and Icecream.    NUTRITION DIAGNOSIS:   Malnutrition related to poor appetite, altered GI function as evidenced by severe depletion of body fat, severe depletion of muscle mass.  GOAL:   Patient will meet greater than or equal to 90% of their needs  MONITOR:    (Energy Intake, Anthropometrics, Digestive System, Electrolyte/Renal Profile)  REASON FOR ASSESSMENT:   Malnutrition Screening Tool    ASSESSMENT:    Pt admitted with difficulty breathing, sepsis with pneumonia; hx of stage I lung cancer, cancer free since October  Past Medical History  Diagnosis Date  . Cancer     Lung Cancer    Diet Order:  Diet regular Room service appropriate?: Yes; Fluid consistency:: Thin   Energy Intake: pt ate graham crackers and ice cream this AM with SLP; pt did not eat lunch tray but did want some cream of chicken soup, writer called down for this.   Food and Nutrition related history: Pt reports appetite fairly good but intake limited due to swallowing. When asked what he has been eating, pt states milkshakes. Pt reports he and his wife share the cooking responsibilities and he does the grocery shopping.   Digestive System: pt reports difficulty swallowing; reports difficulty with solid foods, not as much with liquids. Tolerates puree consistancy items best such as icecream, pudding, mashed potatoes.  SLP consulted.   Skin:  Reviewed, no issues   Nutrition focused physical exam: Nutrition-Focused physical exam completed. Findings are severe fat depletion, severe muscle depletion, and no edema.   Electrolyte and Renal  Profile:  Recent Labs Lab 12/20/14 1915 12/21/14 0546  BUN 13  --   CREATININE 0.62 0.51*  NA 133*  --   K 3.9  --    Glucose Profile:   Recent Labs  12/20/14 2130  GLUCAP 188*   Protein Profile: No results for input(s): ALBUMIN in the last 168 hours.    Meds: NS at 125 ml/hr, solumedrol  Height:   Ht Readings from Last 1 Encounters:  12/20/14 '5\' 3"'$  (1.6 m)    Weight: Pt acknoledges weight loss. Reports he weighed 135 pounds in 2010 when he had his lung surgery. Reports 2 pound wt loss in past week (2.4%wt loss), 10 pound wt loss since beginning of the year; 10.8% wt loss.  Wt Readings from Last 1 Encounters:  12/20/14 82 lb 7.2 oz (37.4 kg)      Wt Readings from Last 10 Encounters:  12/20/14 82 lb 7.2 oz (37.4 kg)    BMI:  Body mass index is 14.61 kg/(m^2).  Estimated Nutritional Needs:   Kcal:  1884-1660 kcals (BEE 1210, 1.3 AF, 1.0-1.2 IF) using IBW 56 kg  Protein:  56-67 g (1.0-1.2 g/kg)   Fluid:  1680-1960 mL (30-35 ml/kg)      HIGH Care Level  Kerman Passey MS, RD, LDN [phone] Pager

## 2014-12-21 NOTE — Progress Notes (Signed)
ANTIBIOTIC CONSULT NOTE - FOLLOW UP  Pharmacy Consult for Vancomycin/Zosyn Indication: sepsis/bilateral pneumonia  No Known Allergies  Patient Measurements: Height: '5\' 3"'$  (160 cm) Weight: 82 lb 7.2 oz (37.4 kg) IBW/kg (Calculated) : 56.9   Vital Signs: Temp: 97.5 F (36.4 C) (07/26 0700) Temp Source: Oral (07/26 0700) BP: 94/59 mmHg (07/26 1200) Pulse Rate: 74 (07/26 1200) Intake/Output from previous day: 07/25 0701 - 07/26 0700 In: 1204.2 [I.V.:904.2; IV Piggyback:300] Out: 900 [Urine:900] Intake/Output from this shift: Total I/O In: 50 [IV Piggyback:50] Out: -   Labs:  Recent Labs  12/20/14 1915 12/21/14 0546  WBC 15.1* 17.0*  HGB 11.2* 9.5*  PLT 332 263  CREATININE 0.62 0.51*   Estimated Creatinine Clearance: 45.5 mL/min (by C-G formula based on Cr of 0.51). No results for input(s): VANCOTROUGH, VANCOPEAK, VANCORANDOM, GENTTROUGH, GENTPEAK, GENTRANDOM, TOBRATROUGH, TOBRAPEAK, TOBRARND, AMIKACINPEAK, AMIKACINTROU, AMIKACIN in the last 72 hours.   Kinetics:   Ke: 0.042 Vd: 26.2   Microbiology: Recent Results (from the past 720 hour(s))  Blood culture (routine x 2)     Status: None (Preliminary result)   Collection Time: 12/20/14  7:16 PM  Result Value Ref Range Status   Specimen Description BLOOD LEFTARM  Final   Special Requests BOTTLES DRAWN AEROBIC AND ANAEROBIC 2ML  Final   Culture NO GROWTH < 12 HOURS  Final   Report Status PENDING  Incomplete  Blood culture (routine x 2)     Status: None (Preliminary result)   Collection Time: 12/20/14  7:16 PM  Result Value Ref Range Status   Specimen Description BLOOD R FOREARM  Final   Special Requests BOTTLES DRAWN AEROBIC AND ANAEROBIC 2ML  Final   Culture NO GROWTH < 12 HOURS  Final   Report Status PENDING  Incomplete  MRSA PCR Screening     Status: None   Collection Time: 12/20/14  9:35 PM  Result Value Ref Range Status   MRSA by PCR NEGATIVE NEGATIVE Final    Anti-infectives    Start     Dose/Rate  Route Frequency Ordered Stop   12/21/14 1800  vancomycin (VANCOCIN) 500 mg in sodium chloride 0.9 % 100 mL IVPB     500 mg 100 mL/hr over 60 Minutes Intravenous Every 18 hours 12/20/14 2205     12/21/14 1400  vancomycin (VANCOCIN) 500 mg in sodium chloride 0.9 % 100 mL IVPB     500 mg 100 mL/hr over 60 Minutes Intravenous Every 18 hours 12/21/14 1308     12/21/14 1200  piperacillin-tazobactam (ZOSYN) IVPB 3.375 g     3.375 g 12.5 mL/hr over 240 Minutes Intravenous 4 times per day 12/21/14 1130     12/21/14 1145  vancomycin (VANCOCIN) IVPB 1000 mg/200 mL premix  Status:  Discontinued     1,000 mg 200 mL/hr over 60 Minutes Intravenous Every 24 hours 12/21/14 1130 12/21/14 1308   12/20/14 2215  piperacillin-tazobactam (ZOSYN) IVPB 4.5 g  Status:  Discontinued     4.5 g 200 mL/hr over 30 Minutes Intravenous 3 times per day 12/20/14 2208 12/20/14 2235   12/20/14 2200  cefTRIAXone (ROCEPHIN) 1 g in dextrose 5 % 50 mL IVPB  Status:  Discontinued     1 g 100 mL/hr over 30 Minutes Intravenous Every 24 hours 12/20/14 2147 12/21/14 1130   12/20/14 2145  cefTRIAXone (ROCEPHIN) 1 g in dextrose 5 % 50 mL IVPB - Premix  Status:  Discontinued     1 g 100 mL/hr over 30 Minutes Intravenous Every  24 hours 12/20/14 2137 12/20/14 2146   12/20/14 2145  azithromycin (ZITHROMAX) 500 mg in dextrose 5 % 250 mL IVPB     500 mg 250 mL/hr over 60 Minutes Intravenous Every 24 hours 12/20/14 2137 12/27/14 2144   12/20/14 1900  vancomycin (VANCOCIN) IVPB 1000 mg/200 mL premix     1,000 mg 200 mL/hr over 60 Minutes Intravenous  Once 12/20/14 1846 12/20/14 2111   12/20/14 1900  piperacillin-tazobactam (ZOSYN) IVPB 3.375 g     3.375 g 12.5 mL/hr over 240 Minutes Intravenous  Once 12/20/14 1846 12/20/14 2000      Assessment: 69 y/o M on empiric abx for sepsis due to b/l PNA.   Goal of Therapy:  Vancomycin trough level 15-20 mcg/ml  Plan:  Will change vancomycin to 500 mg iv q 18 hours and check a trough with  the fourth dose. Conintue azithromycin and Zosyn as ordered. Will f/u renal function and culture results.   Ulice Dash D 12/21/2014,1:10 PM

## 2014-12-21 NOTE — Consult Note (Signed)
Date: 12/21/2014,   MRN# [id number] Stephen Pollard 1945-09-08 Code Status:     Code Status Orders        Start     Ordered   12/20/14 2054  Do not attempt resuscitation (DNR)   Continuous    Question Answer Comment  In the event of cardiac or respiratory ARREST Do not call a "code blue"   In the event of cardiac or respiratory ARREST Do not perform Intubation, CPR, defibrillation or ACLS   In the event of cardiac or respiratory ARREST Use medication by any route, position, wound care, and other measures to relive pain and suffering. May use oxygen, suction and manual treatment of airway obstruction as needed for comfort.      12/20/14 2054          AdmissionWeight: 88 lb 4.8 oz (40.053 kg)                 CurrentWeight: 82 lb 7.2 oz (37.4 kg) Stephen Pollard is a 69 y.o. old male seen in consultation for pneumonia and SOB    CHIEF COMPLAINT:   SOB, pneumonia   HISTORY OF PRESENT ILLNESS  69 y.o. male with a known history of COPD and stage I lung cancer first diagnosed in 2010 status post partial lung resection followed by Dr. Grayland Ormond is being admitted for acute SOb with acute mental status changes. Patient  has been having intermittent fever with cough and shortness of breath for last 3 weeks.   Patient reports continued cough but no fevers still with SOB,    While in the ED he was noted to have pneumonia on chest x-ray and criteria meeting sepsis based on vitals. He had a temperature up to 103.5. While in the emergency department and his heart rate was running anywhere from 110 to 140s with a respiratory rate anywhere from 24-30s. Also reports gradual weight loss of about 6-8 pounds over last 6-8 months 2 pounds in last 2 weeks. Patient states that he has problems with solids and occasional liquids.   Daughter at bedside. He denies any hemoptysis.  Ct chest reviewed with daughter  Patient more alert and awake, this AM  PAST MEDICAL HISTORY   Past Medical  History  Diagnosis Date  . Cancer     Lung Cancer     SURGICAL HISTORY   Past Surgical History  Procedure Laterality Date  . Lung removal, partial Bilateral   . Cholecystectomy    . Knee surgery Left      FAMILY HISTORY   History reviewed. No pertinent family history.   SOCIAL HISTORY   History  Substance Use Topics  . Smoking status: Former Research scientist (life sciences)  . Smokeless tobacco: Not on file  . Alcohol Use: Yes     Comment: occasionally     MEDICATIONS    Home Medication:  No current outpatient prescriptions on file.  Current Medication:  Current facility-administered medications:  .  0.9 %  sodium chloride infusion, , Intravenous, Continuous, Max Sane, MD, Last Rate: 125 mL/hr at 12/20/14 2206 .  azithromycin (ZITHROMAX) 500 mg in dextrose 5 % 250 mL IVPB, 500 mg, Intravenous, Q24H, Max Sane, MD, 500 mg at 12/20/14 2314 .  budesonide (PULMICORT) nebulizer solution 0.25 mg, 0.25 mg, Nebulization, BID, Flora Lipps, MD .  famotidine (PEPCID) IVPB 20 mg premix, 20 mg, Intravenous, Q12H, Flora Lipps, MD, 20 mg at 12/21/14 1155 .  heparin injection 5,000 Units, 5,000 Units, Subcutaneous, 3 times per day, Vipul Manuella Ghazi,  MD, 5,000 Units at 12/21/14 0549 .  ipratropium-albuterol (DUONEB) 0.5-2.5 (3) MG/3ML nebulizer solution 3 mL, 3 mL, Nebulization, Q4H, Flora Lipps, MD, 3 mL at 12/21/14 1213 .  methylPREDNISolone sodium succinate (SOLU-MEDROL) 40 mg/mL injection 40 mg, 40 mg, Intravenous, Q24H, Flora Lipps, MD, 40 mg at 12/21/14 1005 .  mometasone-formoterol (DULERA) 200-5 MCG/ACT inhaler 2 puff, 2 puff, Inhalation, BID, Flora Lipps, MD, 2 puff at 12/21/14 1005 .  norepinephrine (LEVOPHED) 4 mg in dextrose 5 % 250 mL (0.016 mg/mL) infusion, 5-50 mcg/min, Intravenous, Titrated, Vipul Shah, MD, 5 mcg/min at 12/20/14 2200 .  norepinephrine (LEVOPHED) '4mg'$  in D5W 222m premix infusion, 0-40 mcg/min, Intravenous, Titrated, VMax Sane MD, 0 mcg/min at 12/20/14 2200 .   piperacillin-tazobactam (ZOSYN) IVPB 3.375 g, 3.375 g, Intravenous, 4 times per day, SEpifanio Lesches MD .  tiotropium (SPIRIVA) inhalation capsule 18 mcg, 18 mcg, Inhalation, Daily, KFlora Lipps MD, 18 mcg at 12/21/14 1005 .  vancomycin (VANCOCIN) 500 mg in sodium chloride 0.9 % 100 mL IVPB, 500 mg, Intravenous, Q18H, VMax Sane MD .  vancomycin (VANCOCIN) 500 mg in sodium chloride 0.9 % 100 mL IVPB, 500 mg, Intravenous, Q18H, SEpifanio Lesches MD    ALLERGIES   Review of patient's allergies indicates no known allergies.     REVIEW OF SYSTEMS   Review of Systems  Constitutional: Positive for fever, chills, weight loss and malaise/fatigue.  HENT: Negative for hearing loss and tinnitus.   Eyes: Negative for blurred vision, double vision and photophobia.  Respiratory: Positive for cough, shortness of breath and wheezing.   Cardiovascular: Negative for chest pain, palpitations and orthopnea.  Gastrointestinal: Negative for heartburn, nausea and vomiting.  Musculoskeletal: Negative for myalgias, back pain and neck pain.  Skin: Negative for itching and rash.  Neurological: Negative for dizziness, tingling, tremors and headaches.  Endo/Heme/Allergies: Negative for environmental allergies. Does not bruise/bleed easily.  Psychiatric/Behavioral: Negative for depression.     VS: BP 94/59 mmHg  Pulse 74  Temp(Src) 97.5 F (36.4 C) (Oral)  Resp 23  Ht '5\' 3"'$  (1.6 m)  Wt 82 lb 7.2 oz (37.4 kg)  BMI 14.61 kg/m2  SpO2 100%     PHYSICAL EXAM  Physical Exam  Constitutional: No distress.  Ill appearing, malnourished, temple wasting, sunken eyes  HENT:  Head: Normocephalic and atraumatic.  Eyes: Conjunctivae and EOM are normal. Pupils are equal, round, and reactive to light. No scleral icterus.  Neck: Normal range of motion. Neck supple.  Cardiovascular: Normal rate and regular rhythm.   No murmur heard. Pulmonary/Chest: No respiratory distress. He has no wheezes. He has no  rales.  Abdominal: He exhibits no distension. There is no tenderness. There is no rebound.  Musculoskeletal: Normal range of motion. He exhibits no edema or tenderness.  Neurological: He is alert. He displays normal reflexes. No cranial nerve deficit. Coordination normal.  Skin: Skin is warm and dry. He is not diaphoretic.  Psychiatric: He has a normal mood and affect. His behavior is normal.        LABS    Recent Labs     12/20/14  1915  12/21/14  0546  HGB  11.2*  9.5*  HCT  33.4*  28.7*  MCV  86.9  86.8  WBC  15.1*  17.0*  BUN  13   --   CREATININE  0.62  0.51*  GLUCOSE  180*   --   CALCIUM  8.4*   --   ,    No results for input(s): PH  in the last 72 hours.  Invalid input(s): PCO2, PO2, BASEEXCESS, BASEDEFICITE, TFT    CULTURE RESULTS   Recent Results (from the past 240 hour(s))  Blood culture (routine x 2)     Status: None (Preliminary result)   Collection Time: 12/20/14  7:16 PM  Result Value Ref Range Status   Specimen Description BLOOD LEFTARM  Final   Special Requests BOTTLES DRAWN AEROBIC AND ANAEROBIC 2ML  Final   Culture NO GROWTH < 12 HOURS  Final   Report Status PENDING  Incomplete  Blood culture (routine x 2)     Status: None (Preliminary result)   Collection Time: 12/20/14  7:16 PM  Result Value Ref Range Status   Specimen Description BLOOD R FOREARM  Final   Special Requests BOTTLES DRAWN AEROBIC AND ANAEROBIC 2ML  Final   Culture NO GROWTH < 12 HOURS  Final   Report Status PENDING  Incomplete  MRSA PCR Screening     Status: None   Collection Time: 12/20/14  9:35 PM  Result Value Ref Range Status   MRSA by PCR NEGATIVE NEGATIVE Final          IMAGING    Dg Chest 1 View  12/20/2014   CLINICAL DATA:  Lung cancer 6 years ago. Cough and weakness over the past 2 weeks. On home oxygen. Lower chest pain and nonproductive cough.  EXAM: CHEST  1 VIEW  COMPARISON:  Chest x-ray 03/04/2009 and chest CT 02/18/2013  FINDINGS: There are postsurgical  changes of the left hemi thorax. Volume loss of the left lung with mild cardiomediastinal shift to the left unchanged from the previous CT. There is patchy opacification throughout the left lung which is worse as cannot exclude acute infectious process and less likely recurrent neoplastic disease. Possible small amount left pleural fluid. Right lung is clear. Remainder of the exam is unchanged.  IMPRESSION: Postsurgical volume loss of the left lung with mild cardiomediastinal shift to the left unchanged. Mild worsening patchy opacification throughout the left lung with possible small effusion. Findings may be due to infection, although cannot completely exclude recurrent neoplastic disease. Recommend chest CT with contrast.   Electronically Signed   By: Marin Olp M.D.   On: 12/20/2014 19:40   Ct Chest W Contrast  12/21/2014   CLINICAL DATA:  Shortness of breath.  EXAM: CT CHEST WITH CONTRAST  TECHNIQUE: Multidetector CT imaging of the chest was performed during intravenous contrast administration.  CONTRAST:  23m OMNIPAQUE IOHEXOL 300 MG/ML  SOLN  COMPARISON:  Chest radiograph of December 20, 2014; CT scan of February 18, 2013.  FINDINGS: No pneumothorax is noted. Status post left partial lung resection with associated volume loss and mediastinal shift to the left. There is no evidence of thoracic aortic dissection or aneurysm. Visualized portions of pulmonary arteries appear normal. Airspace opacity is noted in the left lower lobe with small associated loculated pleural effusion most consistent with pneumonia. Cavitary abnormality is noted laterally in the upper portion of the residual left lung with air-fluid level measuring 6.0 x 2.8 cm. Minimal multifocal airspace opacities are noted in the right lung base which may represent subsegmental atelectasis or possibly pneumonia. Bronchiectasis and scarring is seen in the upper portion of the residual left lung. Essentially stable opacities are noted in right lung  apex most consistent with scarring.  IMPRESSION: Findings consistent with partial left lung resection, with associated volume loss and mediastinal shift to the left.  New airspace opacity is noted in the left  lower lobe with small associated loculated pleural effusion consistent with pneumonia.  Also noted is cavitary abnormality seen in the upper portion of the residual left lung laterally with air-fluid level concerning for possible cavitary pneumonia.  Multifocal small airspace opacities are noted posteriorly in the right lung base most consistent with multifocal pneumonia.   Electronically Signed   By: Marijo Conception, M.D.   On: 12/21/2014 09:08         ASSESSMENT/PLAN   69 yo white male admitted to ICU for acute and severe sepsis with acute left sided pneumonia with acute COPD exacerbation  1.oxygen as needed 2.Continue IV abx as prescribed 3.will start steroids 4.will start spiriva and dulera 5.will start albuterol nebs and Pulmicort nebs 6.will give NS boluses to assist with sepsis  Will continue to monitor   I have personally obtained a history, examined the patient, evaluated laboratory and independently reviewed imaging results, formulated the assessment and plan and placed orders.  The Patient requires high complexity decision making for assessment and support, frequent evaluation and titration of therapies, application of advanced monitoring technologies and extensive interpretation of multiple databases. Time spent with patient 55 minutes.  Patient and family  is satisfied with Plan of action and management.    Corrin Parker, M.D.  [facility]  Medical Director [place] Director [facility] Cardio-Pulmonary Department

## 2014-12-21 NOTE — Progress Notes (Signed)
   12/21/14 1330  Clinical Encounter Type  Visited With Patient and family together  Visit Type Initial  Referral From Chaplain  Consult/Referral To Chaplain  Spiritual Encounters  Spiritual Needs Emotional  Stress Factors  Patient Stress Factors Exhausted;Health changes;Major life changes  Family Stress Factors Exhausted;Health changes;Major life changes  Met w/patient & family to provide pastoral care & prayer. Chap. Sherry Blackard G. [place]

## 2014-12-21 NOTE — Progress Notes (Signed)
[place] [facility] [place] NAME: Stephen Pollard    MR#:  [id number]  DATE OF BIRTH:  [birth year]  SUBJECTIVE: 69 year old male patient admitted for pneumonia, sepsis due to pneumonia. Patient noted to have shortness of breath, cough and temp was 103.5 in the emergency room. Seen in the ICU patient is still hypotensive. And the shortness of breath still present.   CHIEF COMPLAINT:   Chief Complaint  Patient presents with  . Shortness of Breath    REVIEW OF SYSTEMS:   Review of Systems  Constitutional: Positive for fever and weight loss. Negative for chills.  HENT: Negative for hearing loss.   Eyes: Negative for blurred vision, double vision and photophobia.  Respiratory: Positive for cough and shortness of breath. Negative for hemoptysis.   Cardiovascular: Negative for palpitations, orthopnea and leg swelling.  Gastrointestinal: Negative for vomiting, abdominal pain and diarrhea.  Genitourinary: Negative for dysuria and urgency.  Musculoskeletal: Negative for myalgias and neck pain.  Skin: Negative for rash.  Neurological: Negative for dizziness, focal weakness, seizures, weakness and headaches.  Psychiatric/Behavioral: Negative for memory loss. The patient does not have insomnia.    DRUG ALLERGIES:  No Known Allergies  VITALS:  Blood pressure 94/57, pulse 80, temperature 97.5 F (36.4 C), temperature source Oral, resp. rate 35, height '5\' 3"'$  (1.6 m), weight 37.4 kg (82 lb 7.2 oz), SpO2 100 %.  PHYSICAL EXAMINATION:  GENERAL:  69 y.o.-year-old patient lying in the bed with no acute distress. Cachectic. EYES: Pupils equal, round, reactive to light and accommodation. No scleral icterus. Extraocular muscles intact.  HEENT: Head atraumatic, normocephalic. Oropharynx and nasopharynx clear.  NECK:  Supple, no jugular venous distention. No thyroid enlargement, no tenderness.  LUNGS: Decreased breath sounds bilaterally but no wheezing, no  rales. CARDIOVASCULAR: S1, S2 normal. No murmurs, rubs, or gallops.  ABDOMEN: Soft, nontender, nondistended. Bowel sounds present. No organomegaly or mass.  EXTREMITIES: No pedal edema, cyanosis, or clubbing.  NEUROLOGIC: Cranial nerves II through XII are intact. Muscle strength 5/5 in all extremities. Sensation intact. Gait not checked.  PSYCHIATRIC: The patient is alert and oriented x 3.  SKIN: No obvious rash, lesion, or ulcer.    LABORATORY PANEL:   CBC  Recent Labs Lab 12/21/14 0546  WBC 17.0*  HGB 9.5*  HCT 28.7*  PLT 263   ------------------------------------------------------------------------------------------------------------------  Chemistries   Recent Labs Lab 12/20/14 1915 12/21/14 0546  NA 133*  --   K 3.9  --   CL 97*  --   CO2 29  --   GLUCOSE 180*  --   BUN 13  --   CREATININE 0.62 0.51*  CALCIUM 8.4*  --    ------------------------------------------------------------------------------------------------------------------  Cardiac Enzymes  Recent Labs Lab 12/20/14 1915  TROPONINI <0.03   ------------------------------------------------------------------------------------------------------------------  RADIOLOGY:  Dg Chest 1 View  12/20/2014   CLINICAL DATA:  Lung cancer 6 years ago. Cough and weakness over the past 2 weeks. On home oxygen. Lower chest pain and nonproductive cough.  EXAM: CHEST  1 VIEW  COMPARISON:  Chest x-ray 03/04/2009 and chest CT 02/18/2013  FINDINGS: There are postsurgical changes of the left hemi thorax. Volume loss of the left lung with mild cardiomediastinal shift to the left unchanged from the previous CT. There is patchy opacification throughout the left lung which is worse as cannot exclude acute infectious process and less likely recurrent neoplastic disease. Possible small amount left pleural fluid. Right lung is clear. Remainder of the exam is  unchanged.  IMPRESSION: Postsurgical volume loss of the left lung with mild  cardiomediastinal shift to the left unchanged. Mild worsening patchy opacification throughout the left lung with possible small effusion. Findings may be due to infection, although cannot completely exclude recurrent neoplastic disease. Recommend chest CT with contrast.   Electronically Signed   By: Marin Olp M.D.   On: 12/20/2014 19:40   Ct Chest W Contrast  12/21/2014   CLINICAL DATA:  Shortness of breath.  EXAM: CT CHEST WITH CONTRAST  TECHNIQUE: Multidetector CT imaging of the chest was performed during intravenous contrast administration.  CONTRAST:  40m OMNIPAQUE IOHEXOL 300 MG/ML  SOLN  COMPARISON:  Chest radiograph of December 20, 2014; CT scan of February 18, 2013.  FINDINGS: No pneumothorax is noted. Status post left partial lung resection with associated volume loss and mediastinal shift to the left. There is no evidence of thoracic aortic dissection or aneurysm. Visualized portions of pulmonary arteries appear normal. Airspace opacity is noted in the left lower lobe with small associated loculated pleural effusion most consistent with pneumonia. Cavitary abnormality is noted laterally in the upper portion of the residual left lung with air-fluid level measuring 6.0 x 2.8 cm. Minimal multifocal airspace opacities are noted in the right lung base which may represent subsegmental atelectasis or possibly pneumonia. Bronchiectasis and scarring is seen in the upper portion of the residual left lung. Essentially stable opacities are noted in right lung apex most consistent with scarring.  IMPRESSION: Findings consistent with partial left lung resection, with associated volume loss and mediastinal shift to the left.  New airspace opacity is noted in the left lower lobe with small associated loculated pleural effusion consistent with pneumonia.  Also noted is cavitary abnormality seen in the upper portion of the residual left lung laterally with air-fluid level concerning for possible cavitary pneumonia.   Multifocal small airspace opacities are noted posteriorly in the right lung base most consistent with multifocal pneumonia.   Electronically Signed   By: JMarijo Conception M.D.   On: 12/21/2014 09:08    EKG:   Orders placed or performed during the hospital encounter of 12/20/14  . ED EKG  . ED EKG  . EKG 12-Lead  . EKG 12-Lead    ASSESSMENT AND PLAN:   1. septic shock secondary to bilateral pneumonia: CT of the chest confirmed multifocal pneumonia: Continue IV fluids and keep map over 60 #2 multifocal pneumonia patient is on now Rocephin, Zithromax. But he finished 2 rounds of antibiotics recently so I would like to change it to vancomycin, Zosyn. Obtain pulmonary consult. Continue IV steroids, nebulizers, Spiriva. #3 history of lung cancer ; in remission now. Slight hyperlipidemia continue statins. 4.Glaucoma; continue eyedrops.  All the records are reviewed and case discussed with Care Management/Social Workerr. Management plans discussed with the patient, family and they are in agreement.  CODE STATUS: Full Discussed with the patient's daughter.  TOTAL TIME TAKING CARE OF THIS PATIENT: 35 minutes. (critical care)  POSSIBLE D/C IN 4-5  DAYS, DEPENDING ON CLINICAL CONDITION.   KEpifanio LeschesM.D on 12/21/2014 at 11:23 AM  Between 7am to 6pm - Pager - [phone]  After 6pm go to [email or web address] - password [email or web address] [facility]  Office  [phone] CC: Primary care physician; No primary care provider on file.

## 2014-12-21 NOTE — Progress Notes (Signed)
Pt received from ER. Alert and oriented. No c/o SOB now. Getting o2 via [place]. Sao2 97%. Lung sound diminished .UOP good. SR on Cm. IVF NS is in progress. Resting comfortably in bed without out any distress.will continue to observe c closely.

## 2014-12-21 NOTE — Progress Notes (Addendum)
Pt A&O.  On 2L, O2  within normal limits.  Pt maintaining MAP above 60.  2 IV boluses given throughout day.  Pt using urinal with adequate output. Pt has thick green productive cough. No complaints of pain or SOB. NS at 181m/hr in the left arm. Pt had speech evaluation today with no problems noted, pt on regular diet.   Will contuinue to monitor.

## 2014-12-21 NOTE — Evaluation (Signed)
Clinical/Bedside Swallow Evaluation Patient Details  Name: Stephen Pollard MRN: [id number] Date of Birth: Mar 25, 1946  Today's Date: 12/21/2014 Time: SLP Start Time (ACUTE ONLY): 1000 SLP Stop Time (ACUTE ONLY): 1100 SLP Time Calculation (min) (ACUTE ONLY): 60 min  Past Medical History:  Past Medical History  Diagnosis Date  . Cancer     Lung Cancer   Past Surgical History:  Past Surgical History  Procedure Laterality Date  . Lung removal, partial Bilateral   . Cholecystectomy    . Knee surgery Left    HPI:  Pt is a 69 y.o. male with a known history of COPD and stage I lung cancer w/ lobectomy first diagnosed in 2010 status post partial lung resection followed by Dr. Grayland Ormond is being admitted for sepsis likely due to pneumonia. Patient has been having intermittent fever with cough and shortness of breath for last 3 weeks. Has been treated by his primary care physician with Biaxin XL 1000 mg daily for 2 weeks without much relief. Patient was seen by his primary care physician last week and was given Tessalon Perles and no antibiotics. Patient reports continued cough but no fever. On 6 PM today he got extremely short of breath with constant coughing and could not manage at home. Called EMS and was brought down to the emergency department. While in the ED he was noted to have pneumonia on chest x-ray and criteria meeting sepsis based on vitals. He spiked a temperature up to 103.5. While in the emergency department and his heart rate was running anywhere from 110 to 140s with a respiratory rate anywhere from 24-30s. Also reports gradual weight loss of about 6-8 pounds over last 6-8 months 2 pounds in last 2 weeks. All family is at bedside. He denies any hemoptysis. He was released from [place] last October after serial CT scans and follow-ups and was declared cancer free per patient. Pt did not receive Readiation tx per his report. Pt has been eating a regular diet at home but  has had weight loss since 2010 and ~8-10 lbs in the past 6-8 months. Dietician is following. Pt and family stated that pt does get "full easily", he drinks 12 cups of coffee daily, and he can eat purees but has more difficulty w/ solids. Pt has not seen a GI and is not on a PPI.    Assessment / Plan / Recommendation Clinical Impression  Pt presented w/ no overt s/s of aspiration w/ trials of thin liquids, purees, and mech soft/solids; clear vocal quality noted b/t trials and no decline in O2 sats/RR/HR during/post intake. No oral phase deficits noted during po trials; pt cleared timely and fully. Pt appears at reduced risk for aspiration from an oropharyngeal standpoint. Pt did not exhibit any Esophageal deficits or c/o dysmotility but has had some c/o of "fullness" and decreased intake w/ weight loss at home prior to admission. Pt is not being followed by GI or on a PPI at home. Rec. a regular diet w/ aspiration precautions; reflux precautions. Rec. Meds in PUREE for easier swallowing - cut small, or in liquid or crushed form w/ puree. ST will f/u next 1-2 days. May benefit from a GI consult if any further complaints noted.     Aspiration Risk   (reduced)    Diet Recommendation Age appropriate regular solids;Thin (miosten foods well, cut small)   Medication Administration: Whole meds with puree Compensations: Slow rate;Small sips/bites    Other  Recommendations Recommended Consults: Consider GI evaluation (  Dietician following) Oral Care Recommendations: Oral care BID;Oral care before and after PO;Patient independent with oral care Other Recommendations:  (Reflux precautions)   Follow Up Recommendations       Frequency and Duration min 2x/week  1 week   Pertinent Vitals/Pain denied    SLP Swallow Goals  see care plan   Swallow Study Prior Functional Status   resides at home; report of weight loss and feelings of "fullness" at meals    General Date of Onset: 12/20/14 Other Pertinent  Information: Pt is a 69 y.o. male with a known history of COPD and stage I lung cancer w/ lobectomy first diagnosed in 2010 status post partial lung resection followed by Dr. Grayland Ormond is being admitted for sepsis likely due to pneumonia. Patient has been having intermittent fever with cough and shortness of breath for last 3 weeks. Has been treated by his primary care physician with Biaxin XL 1000 mg daily for 2 weeks without much relief. Patient was seen by his primary care physician last week and was given Tessalon Perles and no antibiotics. Patient reports continued cough but no fever. On 6 PM today he got extremely short of breath with constant coughing and could not manage at home. Called EMS and was brought down to the emergency department. While in the ED he was noted to have pneumonia on chest x-ray and criteria meeting sepsis based on vitals. He spiked a temperature up to 103.5. While in the emergency department and his heart rate was running anywhere from 110 to 140s with a respiratory rate anywhere from 24-30s. Also reports gradual weight loss of about 6-8 pounds over last 6-8 months 2 pounds in last 2 weeks. All family is at bedside. He denies any hemoptysis. He was released from [place] last October after serial CT scans and follow-ups and was declared cancer free per patient. Pt did not receive Readiation tx per his report. Pt has been eating a regular diet at home but has had weight loss since 2010 and ~8-10 lbs in the past 6-8 months. Dietician is following. Pt and family stated that pt does get "full easily", he drinks 12 cups of coffee daily, and he can eat purees but has more difficulty w/ solids. Pt has not seen a GI and is not on a PPI.  Type of Study: Bedside swallow evaluation Previous Swallow Assessment: none Diet Prior to this Study: Regular;Thin liquids (but eating/drinking more milkshakes at home per report) Temperature Spikes Noted: Yes (WBC 17.6) Respiratory  Status: Supplemental O2 delivered via (comment) History of Recent Intubation: No Behavior/Cognition: Alert;Pleasant mood;Cooperative Oral Cavity - Dentition:  (U/L dentures) Self-Feeding Abilities: Able to feed self;Needs set up Patient Positioning: Upright in bed Baseline Vocal Quality: Normal Volitional Cough: Strong Volitional Swallow: Able to elicit    Oral/Motor/Sensory Function Overall Oral Motor/Sensory Function: Appears within functional limits for tasks assessed Labial ROM: Within Functional Limits Labial Symmetry: Within Functional Limits Labial Strength: Within Functional Limits Lingual ROM: Within Functional Limits Lingual Symmetry: Within Functional Limits Lingual Strength: Within Functional Limits Facial Symmetry: Within Functional Limits Mandible: Within Functional Limits   Ice Chips Ice chips: Within functional limits Presentation: Spoon;Self Fed (x3 trials)   Thin Liquid Thin Liquid: Within functional limits Presentation: Cup;Self Fed (~12 ozs total)    Nectar Thick Nectar Thick Liquid: Not tested   Honey Thick Honey Thick Liquid: Not tested   Puree Puree: Within functional limits Presentation: Self Fed;Spoon (8 ozs)   Solid   GO  Solid: Within functional limits Presentation: Self Fed (4 graham crackers)       Tyric Rodeheaver 12/21/2014,11:37 AM

## 2014-12-22 DIAGNOSIS — E43 Unspecified severe protein-calorie malnutrition: Secondary | ICD-10-CM | POA: Insufficient documentation

## 2014-12-22 LAB — CBC
HEMATOCRIT: 25.6 % — AB (ref 40.0–52.0)
Hemoglobin: 8.4 g/dL — ABNORMAL LOW (ref 13.0–18.0)
MCH: 28.3 pg (ref 26.0–34.0)
MCHC: 32.6 g/dL (ref 32.0–36.0)
MCV: 86.8 fL (ref 80.0–100.0)
PLATELETS: 255 10*3/uL (ref 150–440)
RBC: 2.95 MIL/uL — ABNORMAL LOW (ref 4.40–5.90)
RDW: 15.1 % — ABNORMAL HIGH (ref 11.5–14.5)
WBC: 16.8 10*3/uL — ABNORMAL HIGH (ref 3.8–10.6)

## 2014-12-22 LAB — COMPREHENSIVE METABOLIC PANEL
ALBUMIN: 1.8 g/dL — AB (ref 3.5–5.0)
ALT: 21 U/L (ref 17–63)
AST: 27 U/L (ref 15–41)
Alkaline Phosphatase: 72 U/L (ref 38–126)
Anion gap: 3 — ABNORMAL LOW (ref 5–15)
BILIRUBIN TOTAL: 0.2 mg/dL — AB (ref 0.3–1.2)
BUN: 8 mg/dL (ref 6–20)
CALCIUM: 7.7 mg/dL — AB (ref 8.9–10.3)
CO2: 26 mmol/L (ref 22–32)
Chloride: 112 mmol/L — ABNORMAL HIGH (ref 101–111)
Creatinine, Ser: 0.47 mg/dL — ABNORMAL LOW (ref 0.61–1.24)
GFR calc Af Amer: >60 mL/min (ref >60)
Glucose, Bld: 153 mg/dL — ABNORMAL HIGH (ref 65–99)
Potassium: 2.9 mmol/L — CL (ref 3.5–5.1)
Sodium: 141 mmol/L (ref 135–145)
TOTAL PROTEIN: 5.4 g/dL — AB (ref 6.5–8.1)

## 2014-12-22 LAB — LEGIONELLA PNEUMOPHILA SEROGP 1 UR AG: L. pneumophila Serogp 1 Ur Ag: NEGATIVE

## 2014-12-22 LAB — POTASSIUM: Potassium: 3.5 mmol/L (ref 3.5–5.1)

## 2014-12-22 LAB — HIV ANTIBODY (ROUTINE TESTING W REFLEX): HIV Screen 4th Generation wRfx: NONREACTIVE

## 2014-12-22 MED ORDER — SENNOSIDES-DOCUSATE SODIUM 8.6-50 MG PO TABS
1.0000 | ORAL_TABLET | Freq: Two times a day (BID) | ORAL | Status: DC | PRN
  Administered 2014-12-22: 1 via ORAL
  Filled 2014-12-22: qty 1

## 2014-12-22 MED ORDER — IPRATROPIUM-ALBUTEROL 0.5-2.5 (3) MG/3ML IN SOLN
3.0000 mL | Freq: Four times a day (QID) | RESPIRATORY_TRACT | Status: DC
  Administered 2014-12-23 – 2014-12-25 (×8): 3 mL via RESPIRATORY_TRACT
  Filled 2014-12-22 (×8): qty 3

## 2014-12-22 MED ORDER — POTASSIUM CHLORIDE 20 MEQ PO PACK
40.0000 meq | PACK | Freq: Once | ORAL | Status: AC
  Administered 2014-12-22: 40 meq via ORAL
  Filled 2014-12-22: qty 2

## 2014-12-22 NOTE — Evaluation (Signed)
Physical Therapy Evaluation Patient Details Name: Stephen Pollard MRN: [id number] DOB: 1946-01-12 Today's Date: 12/22/2014   History of Present Illness  69 yo male who had B lung tissue removed 6 years ago for lung CA is admitted with PNA and sepsis   Clinical Impression  Pt was able to stand today but limitation was RR of 51 with some activity from standing a minute bedside and stepping.  He is motivated to work but will try again tomorrow with pt being out on telemetry floor.  Progress gait on RW as able and tolerated with his vital signs.    Follow Up Recommendations SNF    Equipment Recommendations  None recommended by PT    Recommendations for Other Services       Precautions / Restrictions Precautions Precautions: Fall (telemetry) Precaution Comments: Watch RR and pulses Restrictions Weight Bearing Restrictions: No      Mobility  Bed Mobility Overal bed mobility: Needs Assistance Bed Mobility: Supine to Sit;Sit to Supine     Supine to sit: Min assist Sit to supine: Min assist   General bed mobility comments: Help to pivot mainly due to endurance issues, with pt not being able to roll or pullup trunk without assistance  Transfers Overall transfer level: Needs assistance Equipment used: 1 person hand held assist Transfers: Sit to/from Stand Sit to Stand: Min assist         General transfer comment: assisted under RUE  Ambulation/Gait Ambulation/Gait assistance: Min guard;Min assist Ambulation Distance (Feet): 3 Feet Assistive device: 1 person hand held assist Gait Pattern/deviations: Step-to pattern;Shuffle;Narrow base of support;Trunk flexed Gait velocity: reduced Gait velocity interpretation: Below normal speed for age/gender General Gait Details: sidesteps up side of bed to [place]            Wheelchair Mobility    Modified Rankin (Stroke Patients Only)       Balance Overall balance assessment: Needs assistance Sitting-balance  support: Bilateral upper extremity supported Sitting balance-Leahy Scale: Fair   Postural control: Posterior lean Standing balance support: Single extremity supported Standing balance-Leahy Scale: Poor                               Pertinent Vitals/Pain Pain Assessment: No/denies pain    Home Living Family/patient expects to be discharged to:: Private residence Living Arrangements: Spouse/significant other Available Help at Discharge: Family Type of Home: House       Home Layout: One level Home Equipment: Environmental consultant - 2 wheels;Cane - single point Additional Comments: Has been working in his old job for 2-3 days a week after retiring    Prior Function Level of Independence: Independent               Journalist, newspaper        Extremity/Trunk Assessment   Upper Extremity Assessment: Overall WFL for tasks assessed (muscle wasting in arms)           Lower Extremity Assessment: Generalized weakness      Cervical / Trunk Assessment: Kyphotic  Communication   Communication: No difficulties  Cognition Arousal/Alertness: Awake/alert Behavior During Therapy: WFL for tasks assessed/performed Overall Cognitive Status: Within Functional Limits for tasks assessed                      General Comments General comments (skin integrity, edema, etc.): Pt has fluctuations in vitals with pulses 90 to 102, sats 100% to 94% with activity  and RR fluctuates from 28-51 but upticks are short and not affecting his energy.    Exercises        Assessment/Plan    PT Assessment Patient needs continued PT services  PT Diagnosis Generalized weakness   PT Problem List Decreased strength;Decreased range of motion;Decreased activity tolerance;Decreased balance;Decreased mobility;Decreased coordination;Cardiopulmonary status limiting activity  PT Treatment Interventions DME instruction;Gait training;Functional mobility training;Therapeutic activities;Therapeutic  exercise;Balance training;Neuromuscular re-education;Patient/family education   PT Goals (Current goals can be found in the Care Plan section) Acute Rehab PT Goals Patient Stated Goal: To get up to walk PT Goal Formulation: With patient/family Time For Goal Achievement: 01/05/15 Potential to Achieve Goals: Good    Frequency Min 2X/week   Barriers to discharge Decreased caregiver support (wife cannot lift pt)      Co-evaluation               End of Session Equipment Utilized During Treatment: Oxygen Activity Tolerance: Patient tolerated treatment well;Patient limited by lethargy;Patient limited by fatigue Patient left: in bed;with call bell/phone within reach;with bed alarm set;with family/visitor present;with nursing/sitter in room;Other (comment) (Pt would have gotten to chair but being transferred to floor)           Time: 1694-5038 PT Time Calculation (min) (ACUTE ONLY): 24 min   Charges:   PT Evaluation $Initial PT Evaluation Tier I: 1 Procedure PT Treatments $Therapeutic Activity: 8-22 mins   PT G Codes:        Ramond Dial 12-27-2014, 3:49 PM   Mee Hives, PT MS Acute Rehab Dept. Number: [phone] [id number] and [place] [phone]

## 2014-12-22 NOTE — Care Management Important Message (Signed)
Important Message  Patient Details  Name: Stephen Pollard MRN: [id number] Date of Birth: [birth year]   Medicare Important Message Given:  Yes-second notification given    Juliann Pulse A Allmond 12/22/2014, 11:17 AM

## 2014-12-22 NOTE — Progress Notes (Signed)
[name] NAME: Stephen Pollard    MR#:  [id number]  DATE OF BIRTH:  [birth year]  SUBJECTIVE: 69 year old male patient admitted for pneumonia, sepsis due to pneumonia. Patient noted to have shortness of breath, cough and temp was 103.5 in the emergency room.  Patient feels better today no shortness of breath. Afebrile. Blood pressure is better.   CHIEF COMPLAINT:   Chief Complaint  Patient presents with  . Shortness of Breath    REVIEW OF SYSTEMS:   Review of Systems  Constitutional: Positive for weight loss. Negative for fever and chills.  HENT: Negative for hearing loss.   Eyes: Negative for blurred vision, double vision and photophobia.  Respiratory: Negative for cough, hemoptysis and shortness of breath.   Cardiovascular: Negative for palpitations, orthopnea and leg swelling.  Gastrointestinal: Negative for vomiting, abdominal pain and diarrhea.  Genitourinary: Negative for dysuria and urgency.  Musculoskeletal: Negative for myalgias and neck pain.  Skin: Negative for rash.  Neurological: Negative for dizziness, focal weakness, seizures, weakness and headaches.  Psychiatric/Behavioral: Negative for memory loss. The patient does not have insomnia.    DRUG ALLERGIES:  No Known Allergies  VITALS:  Blood pressure 104/57, pulse 90, temperature 98.2 F (36.8 C), temperature source Oral, resp. rate 27, height '5\' 3"'$  (1.6 m), weight 37.4 kg (82 lb 7.2 oz), SpO2 98 %.  PHYSICAL EXAMINATION:  GENERAL:  69 y.o.-year-old patient lying in the bed with no acute distress. Cachectic. EYES: Pupils equal, round, reactive to light and accommodation. No scleral icterus. Extraocular muscles intact.  HEENT: Head atraumatic, normocephalic. Oropharynx and nasopharynx clear.  NECK:  Supple, no jugular venous distention. No thyroid enlargement, no tenderness.  LUNGS: Decreased breath sounds bilaterally but no wheezing, no rales.  CARDIOVASCULAR: S1, S2 normal. No murmurs, rubs, or gallops.  ABDOMEN: Soft, nontender, nondistended. Bowel sounds present. No organomegaly or mass.  EXTREMITIES: No pedal edema, cyanosis, or clubbing.  NEUROLOGIC: Cranial nerves II through XII are intact. Muscle strength 5/5 in all extremities. Sensation intact. Gait not checked.  PSYCHIATRIC: The patient is alert and oriented x 3.  SKIN: No obvious rash, lesion, or ulcer.    LABORATORY PANEL:   CBC  Recent Labs Lab 12/22/14 0431  WBC 16.8*  HGB 8.4*  HCT 25.6*  PLT 255   ------------------------------------------------------------------------------------------------------------------  Chemistries   Recent Labs Lab 12/22/14 0431 12/22/14 0802  NA 141  --   K 2.9* 3.5  CL 112*  --   CO2 26  --   GLUCOSE 153*  --   BUN 8  --   CREATININE 0.47*  --   CALCIUM 7.7*  --   AST 27  --   ALT 21  --   ALKPHOS 72  --   BILITOT 0.2*  --    ------------------------------------------------------------------------------------------------------------------  Cardiac Enzymes  Recent Labs Lab 12/20/14 1915  TROPONINI <0.03   ------------------------------------------------------------------------------------------------------------------  RADIOLOGY:  Dg Chest 1 View  12/20/2014   CLINICAL DATA:  Lung cancer 6 years ago. Cough and weakness over the past 2 weeks. On home oxygen. Lower chest pain and nonproductive cough.  EXAM: CHEST  1 VIEW  COMPARISON:  Chest x-ray 03/04/2009 and chest CT 02/18/2013  FINDINGS: There are postsurgical changes of the left hemi thorax. Volume loss of the left lung with mild cardiomediastinal shift to the left unchanged from the previous CT. There is patchy opacification throughout the left lung which is worse as cannot exclude acute infectious process  and less likely recurrent neoplastic disease. Possible small amount left pleural fluid. Right lung is clear. Remainder of the exam is unchanged.   IMPRESSION: Postsurgical volume loss of the left lung with mild cardiomediastinal shift to the left unchanged. Mild worsening patchy opacification throughout the left lung with possible small effusion. Findings may be due to infection, although cannot completely exclude recurrent neoplastic disease. Recommend chest CT with contrast.   Electronically Signed   By: Marin Olp M.D.   On: 12/20/2014 19:40   Ct Chest W Contrast  12/21/2014   CLINICAL DATA:  Shortness of breath.  EXAM: CT CHEST WITH CONTRAST  TECHNIQUE: Multidetector CT imaging of the chest was performed during intravenous contrast administration.  CONTRAST:  51m OMNIPAQUE IOHEXOL 300 MG/ML  SOLN  COMPARISON:  Chest radiograph of December 20, 2014; CT scan of February 18, 2013.  FINDINGS: No pneumothorax is noted. Status post left partial lung resection with associated volume loss and mediastinal shift to the left. There is no evidence of thoracic aortic dissection or aneurysm. Visualized portions of pulmonary arteries appear normal. Airspace opacity is noted in the left lower lobe with small associated loculated pleural effusion most consistent with pneumonia. Cavitary abnormality is noted laterally in the upper portion of the residual left lung with air-fluid level measuring 6.0 x 2.8 cm. Minimal multifocal airspace opacities are noted in the right lung base which may represent subsegmental atelectasis or possibly pneumonia. Bronchiectasis and scarring is seen in the upper portion of the residual left lung. Essentially stable opacities are noted in right lung apex most consistent with scarring.  IMPRESSION: Findings consistent with partial left lung resection, with associated volume loss and mediastinal shift to the left.  New airspace opacity is noted in the left lower lobe with small associated loculated pleural effusion consistent with pneumonia.  Also noted is cavitary abnormality seen in the upper portion of the residual left lung laterally with  air-fluid level concerning for possible cavitary pneumonia.  Multifocal small airspace opacities are noted posteriorly in the right lung base most consistent with multifocal pneumonia.   Electronically Signed   By: JMarijo Conception M.D.   On: 12/21/2014 09:08    EKG:   Orders placed or performed during the hospital encounter of 12/20/14  . ED EKG  . ED EKG  . EKG 12-Lead  . EKG 12-Lead    ASSESSMENT AND PLAN:   1. septic shock secondary to bilateral pneumonia: CT of the chest confirmed multifocal pneumonia: Continue IV fluids and keep map over 60,blood cultures no growth. Transfer to floor #2 multifocal pneumonia patient ; continue vancomycin, Zosyn.  pulmonary consult. Continue IV steroids, nebulizers, Spiriva. Added dulera. #3 history of lung cancer ; in remission now. Slight hyperlipidemia continue statins. 4.Glaucoma; continue eyedrops. #5: Severe malnutrition in the contest of chronic illness' Started on  ensure and also [employer] All the records are reviewed and case discussed with Care Management/Social Workerr. Management plans discussed with the patient, family and they are in agreement.  CODE STATUS: Full Discussed with the patient's daughter.  TOTAL TIME TAKING CARE OF THIS PATIENT: 35 minutes. (critical care)  POSSIBLE D/C IN 4-5  DAYS, DEPENDING ON CLINICAL CONDITION.   KEpifanio LeschesM.D on 12/22/2014 at 11:50 AM  Between 7am to 6pm - Pager - [phone]  After 6pm go to [email or web address] - password [email or web address] [facility]port HeightsHospitalists  Office  [phone] CC: Primary care physician; No primary care provider on file.

## 2014-12-22 NOTE — Progress Notes (Signed)
CRITICAL VALUE ALERT  Critical value received:  K+ 2.9  Date of notification:  12/22/14  Time of notification:  0550  Critical value read back: yes  Nurse who received alert:  Donnamae Jude, RN  MD notified (1st page):  Dr. Reece Levy  Time of first page:  [phone]  Responding MD:  Dr. Reece Levy  Time MD responded:  [phone]  Per MD, give 40 mEq oral once.  Will continue to monitor.

## 2014-12-22 NOTE — Progress Notes (Signed)
   12/22/14 1110  Clinical Encounter Type  Visited With Patient and family together  Visit Type Follow-up  Consult/Referral To Chaplain  Spiritual Encounters  Spiritual Needs Emotional  Stress Factors  Patient Stress Factors None identified  Family Stress Factors None identified  Chaplain offered a compassionate presence and support as I rounded. Chaplain Malcolm Hetz A. Alicyn Klann Ext. [phone]

## 2014-12-22 NOTE — Progress Notes (Signed)
PARENTERAL NUTRITION CONSULT NOTE - INITIAL  Pharmacy Consult for Electrolyte Mgmt Indication: Hypokalemia  No Known Allergies  Patient Measurements: Height: '5\' 3"'$  (160 cm) Weight: 82 lb 7.2 oz (37.4 kg) IBW/kg (Calculated) : 56.9  Vital Signs: Temp: 98.2 F (36.8 C) (07/27 0800) Temp Source: Oral (07/27 0800) BP: 104/57 mmHg (07/27 1000) Pulse Rate: 90 (07/27 1000) Intake/Output from previous day: 07/26 0701 - 07/27 0700 In: 3725 [I.V.:3125; IV Piggyback:600] Out: 625 [Urine:625] Intake/Output from this shift: Total I/O In: 375 [I.V.:375] Out: 350 [Urine:350]  Labs:  Recent Labs  12/20/14 1915 12/21/14 0546 12/22/14 0431  WBC 15.1* 17.0* 16.8*  HGB 11.2* 9.5* 8.4*  HCT 33.4* 28.7* 25.6*  PLT 332 263 255     Recent Labs  12/20/14 1915 12/21/14 0546 12/22/14 0431 12/22/14 0802  NA 133*  --  141  --   K 3.9  --  2.9* 3.5  CL 97*  --  112*  --   CO2 29  --  26  --   GLUCOSE 180*  --  153*  --   BUN 13  --  8  --   CREATININE 0.62 0.51* 0.47*  --   CALCIUM 8.4*  --  7.7*  --   PROT  --   --  5.4*  --   ALBUMIN  --   --  1.8*  --   AST  --   --  27  --   ALT  --   --  21  --   ALKPHOS  --   --  72  --   BILITOT  --   --  0.2*  --    Estimated Creatinine Clearance: 45.5 mL/min (by C-G formula based on Cr of 0.47).    Recent Labs  12/20/14 2130  GLUCAP 188*    Medical History: Past Medical History  Diagnosis Date  . Cancer     Lung Cancer    Medications:  Scheduled:  . azithromycin  500 mg Intravenous Q24H  . budesonide (PULMICORT) nebulizer solution  0.25 mg Nebulization BID  . famotidine (PEPCID) IV  20 mg Intravenous Q12H  . heparin  5,000 Units Subcutaneous 3 times per day  . ipratropium-albuterol  3 mL Nebulization Q4H  . methylPREDNISolone (SOLU-MEDROL) injection  40 mg Intravenous Q24H  . mometasone-formoterol  2 puff Inhalation BID  . piperacillin-tazobactam (ZOSYN)  IV  3.375 g Intravenous Q8H  . tiotropium  18 mcg Inhalation  Daily  . vancomycin  500 mg Intravenous Q18H   Infusions:  . sodium chloride 125 mL/hr at 12/22/14 1158  . norepinephrine (LEVOPHED) Adult infusion    . norepinephrine     PRN: senna-docusate  Assessment: Pharmacy consulted to assist in monitoring electrolytes in this 69 y/o M with septic shock due to bilateral PNA.   Plan:  Potassium replaced today and f/u level wnl. Will f/u AM labs.   Ulice Dash D 12/22/2014,12:00 PM

## 2014-12-22 NOTE — Care Management (Signed)
Problem with constipation currently, hypokalemia, low BP, regular diet in place. Potential transfer to acute level of care.

## 2014-12-22 NOTE — Progress Notes (Signed)
Skin vertified by Gay Filler RN

## 2014-12-22 NOTE — Consult Note (Signed)
Date: 12/22/2014,   MRN# [id number] Stephen Pollard [birth year] Code Status:     Code Status Orders        Start     Ordered   12/20/14 2054  Do not attempt resuscitation (DNR)   Continuous    Question Answer Comment  In the event of cardiac or respiratory ARREST Do not call a "code blue"   In the event of cardiac or respiratory ARREST Do not perform Intubation, CPR, defibrillation or ACLS   In the event of cardiac or respiratory ARREST Use medication by any route, position, wound care, and other measures to relive pain and suffering. May use oxygen, suction and manual treatment of airway obstruction as needed for comfort.      12/20/14 2054          AdmissionWeight: 88 lb 4.8 oz (40.053 kg)                 CurrentWeight: 82 lb 7.2 oz (37.4 kg) Stephen Pollard is a 69 y.o. old male seen in consultation for pneumonia and SOB    CHIEF COMPLAINT:   Follow SOB, pneumonia   HISTORY OF PRESENT ILLNESS  Patient breathing more comfortable today, but still SOB, no fevers, chills. No NVD  Patient more alert and awake, this AM  PAST MEDICAL HISTORY   Past Medical History  Diagnosis Date  . Cancer     Lung Cancer     SURGICAL HISTORY   Past Surgical History  Procedure Laterality Date  . Lung removal, partial Bilateral   . Cholecystectomy    . Knee surgery Left      FAMILY HISTORY   History reviewed. No pertinent family history.   SOCIAL HISTORY   History  Substance Use Topics  . Smoking status: Former Research scientist (life sciences)  . Smokeless tobacco: Not on file  . Alcohol Use: Yes     Comment: occasionally     MEDICATIONS    Home Medication:  No current outpatient prescriptions on file.  Current Medication:  Current facility-administered medications:  .  0.9 %  sodium chloride infusion, , Intravenous, Continuous, Max Sane, MD, Last Rate: 125 mL/hr at 12/22/14 1158 .  azithromycin (ZITHROMAX) 500 mg in dextrose 5 % 250 mL IVPB, 500 mg, Intravenous, Q24H, Vipul  Shah, MD, 500 mg at 12/21/14 2048 .  budesonide (PULMICORT) nebulizer solution 0.25 mg, 0.25 mg, Nebulization, BID, Flora Lipps, MD, 0.25 mg at 12/22/14 0753 .  famotidine (PEPCID) IVPB 20 mg premix, 20 mg, Intravenous, Q12H, Flora Lipps, MD, 20 mg at 12/22/14 1215 .  heparin injection 5,000 Units, 5,000 Units, Subcutaneous, 3 times per day, Max Sane, MD, 5,000 Units at 12/22/14 0603 .  ipratropium-albuterol (DUONEB) 0.5-2.5 (3) MG/3ML nebulizer solution 3 mL, 3 mL, Nebulization, Q4H, Flora Lipps, MD, 3 mL at 12/22/14 1150 .  methylPREDNISolone sodium succinate (SOLU-MEDROL) 40 mg/mL injection 40 mg, 40 mg, Intravenous, Q24H, Flora Lipps, MD, 40 mg at 12/22/14 0843 .  mometasone-formoterol (DULERA) 200-5 MCG/ACT inhaler 2 puff, 2 puff, Inhalation, BID, Flora Lipps, MD, 2 puff at 12/22/14 0844 .  norepinephrine (LEVOPHED) 4 mg in dextrose 5 % 250 mL (0.016 mg/mL) infusion, 5-50 mcg/min, Intravenous, Titrated, Vipul Shah, MD, 5 mcg/min at 12/20/14 2200 .  norepinephrine (LEVOPHED) '4mg'$  in D5W 21m premix infusion, 0-40 mcg/min, Intravenous, Titrated, Vipul Shah, MD, 0 mcg/min at 12/20/14 2200 .  piperacillin-tazobactam (ZOSYN) IVPB 3.375 g, 3.375 g, Intravenous, Q8H, SEpifanio Lesches MD, 3.375 g at 12/22/14 0603 .  senna-docusate (Senokot-S) tablet  1 tablet, 1 tablet, Oral, BID PRN, Epifanio Lesches, MD, 1 tablet at 12/22/14 1223 .  tiotropium (SPIRIVA) inhalation capsule 18 mcg, 18 mcg, Inhalation, Daily, Flora Lipps, MD, 18 mcg at 12/22/14 0844 .  vancomycin (VANCOCIN) 500 mg in sodium chloride 0.9 % 100 mL IVPB, 500 mg, Intravenous, Q18H, Epifanio Lesches, MD, 500 mg at 12/22/14 1009    ALLERGIES   Review of patient's allergies indicates no known allergies.     REVIEW OF SYSTEMS   Review of Systems  Constitutional: Positive for weight loss and malaise/fatigue. Negative for fever and chills.  HENT: Negative for hearing loss and tinnitus.   Eyes: Negative for blurred vision,  double vision and photophobia.  Respiratory: Positive for cough, shortness of breath and wheezing.   Cardiovascular: Negative for chest pain, palpitations and orthopnea.  Gastrointestinal: Negative for heartburn, nausea and vomiting.  Musculoskeletal: Negative for myalgias, back pain and neck pain.  Skin: Negative for itching and rash.  Neurological: Negative for dizziness, tingling, tremors and headaches.  Endo/Heme/Allergies: Negative for environmental allergies. Does not bruise/bleed easily.  Psychiatric/Behavioral: Negative for depression.     VS: BP 104/57 mmHg  Pulse 90  Temp(Src) 98.2 F (36.8 C) (Oral)  Resp 27  Ht '5\' 3"'$  (1.6 m)  Wt 82 lb 7.2 oz (37.4 kg)  BMI 14.61 kg/m2  SpO2 98%     PHYSICAL EXAM  Physical Exam  Constitutional: No distress.  Ill appearing, malnourished, temple wasting, sunken eyes  HENT:  Head: Normocephalic and atraumatic.  Eyes: Conjunctivae and EOM are normal. Pupils are equal, round, and reactive to light. No scleral icterus.  Neck: Normal range of motion. Neck supple.  Cardiovascular: Normal rate and regular rhythm.   No murmur heard. Pulmonary/Chest: No respiratory distress. He has no wheezes. He has no rales.  Abdominal: He exhibits no distension. There is no tenderness. There is no rebound.  Musculoskeletal: Normal range of motion. He exhibits no edema or tenderness.  Neurological: He is alert. He displays normal reflexes. No cranial nerve deficit. Coordination normal.  Skin: Skin is warm and dry. He is not diaphoretic.  Psychiatric: He has a normal mood and affect. His behavior is normal.        LABS    Recent Labs     12/20/14  1915  12/21/14  0546  12/22/14  0431  HGB  11.2*  9.5*  8.4*  HCT  33.4*  28.7*  25.6*  MCV  86.9  86.8  86.8  WBC  15.1*  17.0*  16.8*  BUN  13   --   8  CREATININE  0.62  0.51*  0.47*  GLUCOSE  180*   --   153*  CALCIUM  8.4*   --   7.7*  ,    No results for input(s): PH in the last 72  hours.  Invalid input(s): PCO2, PO2, BASEEXCESS, BASEDEFICITE, TFT    CULTURE RESULTS   Recent Results (from the past 240 hour(s))  Blood culture (routine x 2)     Status: None (Preliminary result)   Collection Time: 12/20/14  7:16 PM  Result Value Ref Range Status   Specimen Description BLOOD LEFTARM  Final   Special Requests BOTTLES DRAWN AEROBIC AND ANAEROBIC 2ML  Final   Culture NO GROWTH 2 DAYS  Final   Report Status PENDING  Incomplete  Blood culture (routine x 2)     Status: None (Preliminary result)   Collection Time: 12/20/14  7:16 PM  Result Value Ref  Range Status   Specimen Description BLOOD R FOREARM  Final   Special Requests BOTTLES DRAWN AEROBIC AND ANAEROBIC 2ML  Final   Culture NO GROWTH 2 DAYS  Final   Report Status PENDING  Incomplete  MRSA PCR Screening     Status: None   Collection Time: 12/20/14  9:35 PM  Result Value Ref Range Status   MRSA by PCR NEGATIVE NEGATIVE Final  Urine culture     Status: None (Preliminary result)   Collection Time: 12/21/14 12:30 AM  Result Value Ref Range Status   Specimen Description URINE, CLEAN CATCH  Final   Special Requests Normal  Final   Culture MULTIPLE SPECIES PRESENT, SUGGEST RECOLLECTION  Final   Report Status PENDING  Incomplete          IMAGING    No results found.       ASSESSMENT/PLAN   69 yo white male admitted to ICU for acute and severe sepsis with acute left sided pneumonia with acute COPD exacerbation Patient with slow clinical improvement  1.oxygen as needed 2.Continue IV abx as prescribed 3.continue steroids 4.continue  spiriva and dulera 5.will continue  albuterol nebs and Pulmicort nebs  OK to transfer to gen med floor   I have personally obtained a history, examined the patient, evaluated laboratory and independently reviewed imaging results, formulated the assessment and plan and placed orders.  The Patient requires high complexity decision making for assessment and support,  frequent evaluation and titration of therapies, application of advanced monitoring technologies and extensive interpretation of multiple databases. Time spent with patient 35 minutes.  Patient and family  is satisfied with Plan of action and management.    Corrin Parker, M.D.  [facility]  Medical Director [facility] Director [facility] Cardio-Pulmonary Department

## 2014-12-22 NOTE — Progress Notes (Signed)
ANTIBIOTIC CONSULT NOTE - FOLLOW UP  Pharmacy Consult for Vancomycin/Zosyn Indication: sepsis/BL PNA  No Known Allergies  Patient Measurements: Height: '5\' 3"'$  (160 cm) Weight: 82 lb 7.2 oz (37.4 kg) IBW/kg (Calculated) : 56.9  Vital Signs: Temp: 98.2 F (36.8 C) (07/27 0800) Temp Source: Oral (07/27 0800) BP: 104/57 mmHg (07/27 1000) Pulse Rate: 90 (07/27 1000) Intake/Output from previous day: 07/26 0701 - 07/27 0700 In: 3725 [I.V.:3125; IV Piggyback:600] Out: 625 [Urine:625] Intake/Output from this shift: Total I/O In: 375 [I.V.:375] Out: 350 [Urine:350]  Labs:  Recent Labs  12/20/14 1915 12/21/14 0546 12/22/14 0431  WBC 15.1* 17.0* 16.8*  HGB 11.2* 9.5* 8.4*  PLT 332 263 255  CREATININE 0.62 0.51* 0.47*   Estimated Creatinine Clearance: 45.5 mL/min (by C-G formula based on Cr of 0.47). No results for input(s): VANCOTROUGH, VANCOPEAK, VANCORANDOM, GENTTROUGH, GENTPEAK, GENTRANDOM, TOBRATROUGH, TOBRAPEAK, TOBRARND, AMIKACINPEAK, AMIKACINTROU, AMIKACIN in the last 72 hours.   Microbiology: Recent Results (from the past 720 hour(s))  Blood culture (routine x 2)     Status: None (Preliminary result)   Collection Time: 12/20/14  7:16 PM  Result Value Ref Range Status   Specimen Description BLOOD LEFTARM  Final   Special Requests BOTTLES DRAWN AEROBIC AND ANAEROBIC 2ML  Final   Culture NO GROWTH 2 DAYS  Final   Report Status PENDING  Incomplete  Blood culture (routine x 2)     Status: None (Preliminary result)   Collection Time: 12/20/14  7:16 PM  Result Value Ref Range Status   Specimen Description BLOOD R FOREARM  Final   Special Requests BOTTLES DRAWN AEROBIC AND ANAEROBIC 2ML  Final   Culture NO GROWTH 2 DAYS  Final   Report Status PENDING  Incomplete  MRSA PCR Screening     Status: None   Collection Time: 12/20/14  9:35 PM  Result Value Ref Range Status   MRSA by PCR NEGATIVE NEGATIVE Final  Urine culture     Status: None (Preliminary result)   Collection  Time: 12/21/14 12:30 AM  Result Value Ref Range Status   Specimen Description URINE, CLEAN CATCH  Final   Special Requests Normal  Final   Culture MULTIPLE SPECIES PRESENT, SUGGEST RECOLLECTION  Final   Report Status PENDING  Incomplete    Anti-infectives    Start     Dose/Rate Route Frequency Ordered Stop   12/21/14 1800  vancomycin (VANCOCIN) 500 mg in sodium chloride 0.9 % 100 mL IVPB  Status:  Discontinued     500 mg 100 mL/hr over 60 Minutes Intravenous Every 18 hours 12/20/14 2205 12/21/14 1646   12/21/14 1415  piperacillin-tazobactam (ZOSYN) IVPB 3.375 g     3.375 g 12.5 mL/hr over 240 Minutes Intravenous Every 8 hours 12/21/14 1130     12/21/14 1400  vancomycin (VANCOCIN) 500 mg in sodium chloride 0.9 % 100 mL IVPB     500 mg 100 mL/hr over 60 Minutes Intravenous Every 18 hours 12/21/14 1308     12/21/14 1145  vancomycin (VANCOCIN) IVPB 1000 mg/200 mL premix  Status:  Discontinued     1,000 mg 200 mL/hr over 60 Minutes Intravenous Every 24 hours 12/21/14 1130 12/21/14 1308   12/20/14 2215  piperacillin-tazobactam (ZOSYN) IVPB 4.5 g  Status:  Discontinued     4.5 g 200 mL/hr over 30 Minutes Intravenous 3 times per day 12/20/14 2208 12/20/14 2235   12/20/14 2200  cefTRIAXone (ROCEPHIN) 1 g in dextrose 5 % 50 mL IVPB  Status:  Discontinued  1 g 100 mL/hr over 30 Minutes Intravenous Every 24 hours 12/20/14 2147 12/21/14 1130   12/20/14 2145  cefTRIAXone (ROCEPHIN) 1 g in dextrose 5 % 50 mL IVPB - Premix  Status:  Discontinued     1 g 100 mL/hr over 30 Minutes Intravenous Every 24 hours 12/20/14 2137 12/20/14 2146   12/20/14 2145  azithromycin (ZITHROMAX) 500 mg in dextrose 5 % 250 mL IVPB     500 mg 250 mL/hr over 60 Minutes Intravenous Every 24 hours 12/20/14 2137 12/27/14 2144   12/20/14 1900  vancomycin (VANCOCIN) IVPB 1000 mg/200 mL premix     1,000 mg 200 mL/hr over 60 Minutes Intravenous  Once 12/20/14 1846 12/20/14 2111   12/20/14 1900  piperacillin-tazobactam  (ZOSYN) IVPB 3.375 g     3.375 g 12.5 mL/hr over 240 Minutes Intravenous  Once 12/20/14 1846 12/20/14 2000      Assessment: 69 y/o M on empiric abx including, vancomycin, Zosyn, and azithromycin for sepsis due to b/l PNA  Goal of Therapy:  Vancomycin trough level 15-20 mcg/ml  Plan:  Will continue antibiotics at current doses and f/u renal function, levels, and culture results.   Ulice Dash D 12/22/2014,12:03 PM

## 2014-12-22 NOTE — Progress Notes (Signed)
   12/22/14 2100  Clinical Encounter Type  Visited With Patient  Visit Type Spiritual support  Spiritual Encounters  Spiritual Needs Prayer  Stress Factors  Patient Stress Factors None identified   Status: Sepsis, alert and oriented, good spirits Faith: [employer] Family: None present but he says his church fam is very supportive as well Age/Sex: male 21 Visit Assessment: The patient shared that his pastor sent a bag gift of snacks through his sister and that he is feeling better.  Pastoral care can be reached via pager [phone] or by submitting an online request

## 2014-12-22 NOTE — Progress Notes (Signed)
Speech Language Pathology Treatment: Dysphagia (education w/ pt and family)  Patient Details Name: Stephen Pollard MRN: [id number] DOB: [birth year] Today's Date: 12/22/2014 Time: 1430-1500 SLP Time Calculation (min) (ACUTE ONLY): 30 min  Assessment / Plan / Recommendation Clinical Impression  Pt appears to be tolerating his current mech soft diet w/ thin liquids w/ no reports of dysphagia or s/s of aspiration by pt, family or staff. Spent time w/ pt and family discussing and modeling aspiration precautions and general Reflux precautions. Reviewed strategies and foods options w/ pt and family to aid oral intake targeting moistening foods and aiding Esophageal clearing. Encouraged f/u w/ a Dietician for further diet suggestions. Pt and family agreed. ST will be available for any further education or assessment as nec. during this admission. Family seemed pleased.    HPI Other Pertinent Information: Pt is a 69 y.o. male with a known history of COPD and stage I lung cancer w/ lobectomy first diagnosed in 2010 status post partial lung resection followed by Dr. Grayland Ormond is being admitted for sepsis likely due to pneumonia. Patient has been having intermittent fever with cough and shortness of breath for last 3 weeks. Has been treated by his primary care physician with Biaxin XL 1000 mg daily for 2 weeks without much relief. Patient was seen by his primary care physician last week and was given Tessalon Perles and no antibiotics. Patient reports continued cough but no fever. On 6 PM today he got extremely short of breath with constant coughing and could not manage at home. Called EMS and was brought down to the emergency department. While in the ED he was noted to have pneumonia on chest x-ray and criteria meeting sepsis based on vitals. He spiked a temperature up to 103.5. While in the emergency department and his heart rate was running anywhere from 110 to 140s with a respiratory rate anywhere from  24-30s. Also reports gradual weight loss of about 6-8 pounds over last 6-8 months 2 pounds in last 2 weeks. All family is at bedside. He denies any hemoptysis. He was released from [facility] last October after serial CT scans and follow-ups and was declared cancer free per patient. Pt did not receive Readiation tx per his report. Pt has been eating a regular diet at home but has had weight loss since 2010 and ~8-10 lbs in the past 6-8 months. Dietician is following. Pt and family stated that pt does get "full easily", he drinks 12 cups of coffee daily, and he can eat purees but has more difficulty w/ solids. Pt has not seen a GI and is not on a PPI. Pt tolerated his lunch meal today per family member stating pt ate 2 pieces of pizza.    Pertinent Vitals Pain Assessment: No/denies pain  SLP Plan  Continue with current plan of care    Recommendations Diet recommendations: Dysphagia 3 (mechanical soft);Thin liquid Liquids provided via: Cup;Straw Medication Administration: Whole meds with puree (or w/ liquids as able to tolerate) Supervision: Patient able to self feed;Intermittent supervision to cue for compensatory strategies Compensations: Slow rate;Small sips/bites Postural Changes and/or Swallow Maneuvers: Seated upright 90 degrees              General recommendations:  (potential GI consult) Oral Care Recommendations: Oral care BID;Oral care before and after PO;Patient independent with oral care Follow up Recommendations:  (TBD by PT) Plan: Continue with current plan of care    [place], Beecher, CCC-SLP Watson,Katherine 12/22/2014, 4:46  PM

## 2014-12-22 NOTE — Progress Notes (Signed)
Pt A&O.  Pt on room, O2 within normal limits. NS going at 176m/hr. [facility]

## 2014-12-23 ENCOUNTER — Inpatient Hospital Stay: Payer: Medicare Other

## 2014-12-23 LAB — URINE CULTURE: Special Requests: NORMAL

## 2014-12-23 LAB — BASIC METABOLIC PANEL
Anion gap: 5 (ref 5–15)
BUN: 9 mg/dL (ref 6–20)
CALCIUM: 7.9 mg/dL — AB (ref 8.9–10.3)
CO2: 22 mmol/L (ref 22–32)
CREATININE: 0.49 mg/dL — AB (ref 0.61–1.24)
Chloride: 115 mmol/L — ABNORMAL HIGH (ref 101–111)
GFR calc Af Amer: >60 mL/min (ref >60)
GFR calc non Af Amer: >60 mL/min (ref >60)
GLUCOSE: 114 mg/dL — AB (ref 65–99)
Potassium: 3 mmol/L — ABNORMAL LOW (ref 3.5–5.1)
Sodium: 142 mmol/L (ref 135–145)

## 2014-12-23 LAB — CBC
HEMATOCRIT: 25.9 % — AB (ref 40.0–52.0)
Hemoglobin: 8.3 g/dL — ABNORMAL LOW (ref 13.0–18.0)
MCH: 27.7 pg (ref 26.0–34.0)
MCHC: 32.2 g/dL (ref 32.0–36.0)
MCV: 86.2 fL (ref 80.0–100.0)
Platelets: 291 10*3/uL (ref 150–440)
RBC: 3 MIL/uL — ABNORMAL LOW (ref 4.40–5.90)
RDW: 14.8 % — AB (ref 11.5–14.5)
WBC: 16.4 10*3/uL — AB (ref 3.8–10.6)

## 2014-12-23 LAB — MAGNESIUM: MAGNESIUM: 2 mg/dL (ref 1.7–2.4)

## 2014-12-23 LAB — VANCOMYCIN, TROUGH: Vancomycin Tr: 6 ug/mL — ABNORMAL LOW (ref 10–20)

## 2014-12-23 LAB — PHOSPHORUS: Phosphorus: 1.3 mg/dL — ABNORMAL LOW (ref 2.5–4.6)

## 2014-12-23 MED ORDER — ZOLPIDEM TARTRATE 5 MG PO TABS
5.0000 mg | ORAL_TABLET | Freq: Every evening | ORAL | Status: DC | PRN
  Administered 2014-12-23 – 2014-12-24 (×2): 5 mg via ORAL
  Filled 2014-12-23 (×2): qty 1

## 2014-12-23 MED ORDER — LATANOPROST 0.005 % OP SOLN
1.0000 [drp] | Freq: Every day | OPHTHALMIC | Status: DC
  Administered 2014-12-23 – 2014-12-26 (×4): 1 [drp] via OPHTHALMIC
  Filled 2014-12-23: qty 2.5

## 2014-12-23 MED ORDER — PNEUMOCOCCAL VAC POLYVALENT 25 MCG/0.5ML IJ INJ
0.5000 mL | INJECTION | INTRAMUSCULAR | Status: AC
  Administered 2014-12-24: 0.5 mL via INTRAMUSCULAR
  Filled 2014-12-23: qty 0.5

## 2014-12-23 MED ORDER — POTASSIUM CHLORIDE 10 MEQ/100ML IV SOLN
10.0000 meq | INTRAVENOUS | Status: AC
  Administered 2014-12-23 (×4): 10 meq via INTRAVENOUS
  Filled 2014-12-23 (×6): qty 100

## 2014-12-23 MED ORDER — FUROSEMIDE 10 MG/ML IJ SOLN
40.0000 mg | Freq: Once | INTRAMUSCULAR | Status: AC
  Administered 2014-12-23: 40 mg via INTRAVENOUS

## 2014-12-23 MED ORDER — FUROSEMIDE 10 MG/ML IJ SOLN
INTRAMUSCULAR | Status: AC
  Filled 2014-12-23: qty 4

## 2014-12-23 MED ORDER — FUROSEMIDE 10 MG/ML IJ SOLN
40.0000 mg | Freq: Two times a day (BID) | INTRAMUSCULAR | Status: AC
  Administered 2014-12-23: 40 mg via INTRAVENOUS
  Filled 2014-12-23: qty 4

## 2014-12-23 MED ORDER — VANCOMYCIN HCL 500 MG IV SOLR
500.0000 mg | Freq: Three times a day (TID) | INTRAVENOUS | Status: DC
  Administered 2014-12-24 (×3): 500 mg via INTRAVENOUS
  Filled 2014-12-23 (×5): qty 500

## 2014-12-23 NOTE — Progress Notes (Signed)
ANTIBIOTIC CONSULT NOTE - FOLLOW UP  Pharmacy Consult for Vancomycin/Zosyn Indication: sepsis/BL PNA  No Known Allergies  Patient Measurements: Height: '5\' 3"'$  (160 cm) Weight: 102 lb 11.2 oz (46.584 kg) IBW/kg (Calculated) : 56.9  Vital Signs: Temp: 97.8 F (36.6 C) (07/28 1148) BP: 101/58 mmHg (07/28 2105) Pulse Rate: 81 (07/28 2105) Intake/Output from previous day: 07/27 0701 - 07/28 0700 In: 3808.8 [P.O.:240; I.V.:2968.8; IV Piggyback:600] Out: 1075 [Urine:1075] Intake/Output from this shift:    Labs:  Recent Labs  12/21/14 0546 12/22/14 0431 12/23/14 0358  WBC 17.0* 16.8* 16.4*  HGB 9.5* 8.4* 8.3*  PLT 263 255 291  CREATININE 0.51* 0.47* 0.49*   Estimated Creatinine Clearance: 56.6 mL/min (by C-G formula based on Cr of 0.49).  Recent Labs  12/23/14 [place] 6*     Microbiology: Recent Results (from the past 720 hour(s))  Blood culture (routine x 2)     Status: None (Preliminary result)   Collection Time: 12/20/14  7:16 PM  Result Value Ref Range Status   Specimen Description BLOOD LEFTARM  Final   Special Requests BOTTLES DRAWN AEROBIC AND ANAEROBIC 2ML  Final   Culture NO GROWTH 3 DAYS  Final   Report Status PENDING  Incomplete  Blood culture (routine x 2)     Status: None (Preliminary result)   Collection Time: 12/20/14  7:16 PM  Result Value Ref Range Status   Specimen Description BLOOD R FOREARM  Final   Special Requests BOTTLES DRAWN AEROBIC AND ANAEROBIC 2ML  Final   Culture NO GROWTH 3 DAYS  Final   Report Status PENDING  Incomplete  MRSA PCR Screening     Status: None   Collection Time: 12/20/14  9:35 PM  Result Value Ref Range Status   MRSA by PCR NEGATIVE NEGATIVE Final  Urine culture     Status: None   Collection Time: 12/21/14 12:30 AM  Result Value Ref Range Status   Specimen Description URINE, CLEAN CATCH  Final   Special Requests Normal  Final   Culture MULTIPLE SPECIES PRESENT, SUGGEST RECOLLECTION  Final   Report  Status 12/23/2014 FINAL  Final    Anti-infectives    Start     Dose/Rate Route Frequency Ordered Stop   12/24/14 0500  vancomycin (VANCOCIN) 500 mg in sodium chloride 0.9 % 100 mL IVPB     500 mg 100 mL/hr over 60 Minutes Intravenous Every 8 hours 12/23/14 2131     12/21/14 1800  vancomycin (VANCOCIN) 500 mg in sodium chloride 0.9 % 100 mL IVPB  Status:  Discontinued     500 mg 100 mL/hr over 60 Minutes Intravenous Every 18 hours 12/20/14 2205 12/21/14 1646   12/21/14 1415  piperacillin-tazobactam (ZOSYN) IVPB 3.375 g     3.375 g 12.5 mL/hr over 240 Minutes Intravenous Every 8 hours 12/21/14 1130     12/21/14 1400  vancomycin (VANCOCIN) 500 mg in sodium chloride 0.9 % 100 mL IVPB  Status:  Discontinued     500 mg 100 mL/hr over 60 Minutes Intravenous Every 18 hours 12/21/14 1308 12/23/14 2131   12/21/14 1145  vancomycin (VANCOCIN) IVPB 1000 mg/200 mL premix  Status:  Discontinued     1,000 mg 200 mL/hr over 60 Minutes Intravenous Every 24 hours 12/21/14 1130 12/21/14 1308   12/20/14 2215  piperacillin-tazobactam (ZOSYN) IVPB 4.5 g  Status:  Discontinued     4.5 g 200 mL/hr over 30 Minutes Intravenous 3 times per day 12/20/14 2208 12/20/14 2235  12/20/14 2200  cefTRIAXone (ROCEPHIN) 1 g in dextrose 5 % 50 mL IVPB  Status:  Discontinued     1 g 100 mL/hr over 30 Minutes Intravenous Every 24 hours 12/20/14 2147 12/21/14 1130   12/20/14 2145  cefTRIAXone (ROCEPHIN) 1 g in dextrose 5 % 50 mL IVPB - Premix  Status:  Discontinued     1 g 100 mL/hr over 30 Minutes Intravenous Every 24 hours 12/20/14 2137 12/20/14 2146   12/20/14 2145  azithromycin (ZITHROMAX) 500 mg in dextrose 5 % 250 mL IVPB     500 mg 250 mL/hr over 60 Minutes Intravenous Every 24 hours 12/20/14 2137 12/27/14 2144   12/20/14 1900  vancomycin (VANCOCIN) IVPB 1000 mg/200 mL premix     1,000 mg 200 mL/hr over 60 Minutes Intravenous  Once 12/20/14 1846 12/20/14 2111   12/20/14 1900  piperacillin-tazobactam (ZOSYN) IVPB  3.375 g     3.375 g 12.5 mL/hr over 240 Minutes Intravenous  Once 12/20/14 1846 12/20/14 2000      Assessment: 69 y/o M on empiric abx including, vancomycin, Zosyn, and azithromycin for sepsis due to b/l PNA  Goal of Therapy:  Vancomycin trough level 15-20 mcg/ml  Plan:  Will continue antibiotics at current doses and f/u renal function, levels, and culture results.   7/28:  Vanc trough @ 19:30 = 6 mcg/mL           Will increase dose to Vancomycin 500 mg IV Q8H to start 7/29 @ 5:00.           Will recheck Vanc trough before 3rd new dose on 7/29 @ 20:30.   Reve Crocket D 12/23/2014,9:32 PM

## 2014-12-23 NOTE — Progress Notes (Addendum)
Assessment this AM revealed patients o2 sats in 70s, lung crackles bilaterally, pt c/o SOB. Dr. Vianne Bulls paged. Received orders for CXR, 40 IV lasix, stop IVF and to put on o2. After breathing tx, satting 94% on NRB. Pt a&o, lasix given.  Pt also placed on continuous spO2 for monitoring.

## 2014-12-23 NOTE — Progress Notes (Signed)
[facility] NAME: Stephen Pollard    MR#:  [id number]  DATE OF BIRTH:  1946-02-22  SUBJECTIVE: 69 year old male patient admitted for pneumonia, sepsis due to pneumonia. Patient noted to have shortness of breath, cough and temp was 103.5 fi n the emergency room. Short of breath today ,hypoxia wi th sats 78%.received lasix.on NRM NOW FEELS BETTER,o2 sats 94%on nrm   CHIEF COMPLAINT:   Chief Complaint  Patient presents with  . Shortness of Breath    REVIEW OF SYSTEMS:   Review of Systems  Constitutional: Positive for weight loss. Negative for fever and chills.  HENT: Negative for hearing loss.   Eyes: Negative for blurred vision, double vision and photophobia.  Respiratory: Negative for cough, hemoptysis and shortness of breath.   Cardiovascular: Negative for palpitations, orthopnea and leg swelling.  Gastrointestinal: Negative for vomiting, abdominal pain and diarrhea.  Genitourinary: Negative for dysuria and urgency.  Musculoskeletal: Negative for myalgias and neck pain.  Skin: Negative for rash.  Neurological: Negative for dizziness, focal weakness, seizures, weakness and headaches.  Psychiatric/Behavioral: Negative for memory loss. The patient does not have insomnia.    DRUG ALLERGIES:  No Known Allergies  VITALS:  Blood pressure 111/70, pulse 82, temperature 98.2 F (36.8 C), temperature source Oral, resp. rate 22, height '5\' 3"'$  (1.6 m), weight 46.584 kg (102 lb 11.2 oz), SpO2 94 %.  PHYSICAL EXAMINATION:  GENERAL:  69 y.o.-year-old patient lying in the bed with no acute distress. Cachectic. EYES: Pupils equal, round, reactive to light and accommodation. No scleral icterus. Extraocular muscles intact.  HEENT: Head atraumatic, normocephalic. Oropharynx and nasopharynx clear.  NECK:  Supple, no jugular venous distention. No thyroid enlargement, no tenderness.  LUNGS: coarse breath sounds  Present.bilaterally.  CARDIOVASCULAR: S1, S2 normal. No murmurs, rubs, or gallops.  ABDOMEN: Soft, nontender, nondistended. Bowel sounds present. No organomegaly or mass.  EXTREMITIES: No pedal edema, cyanosis, or clubbing.  NEUROLOGIC: Cranial nerves II through XII are intact. Muscle strength 5/5 in all extremities. Sensation intact. Gait not checked.  PSYCHIATRIC: The patient is alert and oriented x 3.  SKIN: No obvious rash, lesion, or ulcer.    LABORATORY PANEL:   CBC  Recent Labs Lab 12/23/14 0358  WBC 16.4*  HGB 8.3*  HCT 25.9*  PLT 291   ------------------------------------------------------------------------------------------------------------------  Chemistries   Recent Labs Lab 12/22/14 0431  12/23/14 0358  NA 141  --  142  K 2.9*  < > 3.0*  CL 112*  --  115*  CO2 26  --  22  GLUCOSE 153*  --  114*  BUN 8  --  9  CREATININE 0.47*  --  0.49*  CALCIUM 7.7*  --  7.9*  MG  --   --  2.0  AST 27  --   --   ALT 21  --   --   ALKPHOS 72  --   --   BILITOT 0.2*  --   --   < > = values in this interval not displayed. ------------------------------------------------------------------------------------------------------------------  Cardiac Enzymes  Recent Labs Lab 12/20/14 1915  TROPONINI <0.03   ------------------------------------------------------------------------------------------------------------------  RADIOLOGY:  Dg Chest 1 View  12/23/2014   CLINICAL DATA:  History of lung carcinoma.  Cough and rales  EXAM: CHEST  1 VIEW  COMPARISON:  November 20, 2014 chest radiograph and chest CT December 21, 2014  FINDINGS: There is postoperative change on the left with volume loss. There is shift of  the mediastinum toward the left. The area of consolidation with cavitation in the left mid lung is not felt to be changed but is better seen by CT. Patchy airspace disease in the left lower lobe is stable. There is no new opacity on the left. The right lung is hyperexpanded. There is rather minimal  patchy opacity in the right base, less pronounced than on the recent CT. The heart size is normal. Pulmonary vascularity on the right is normal. Pulmonary vascular on the left is distorted by the postoperative change. There is evidence of old rib trauma on the right with remodeling.  IMPRESSION: Postoperative change with volume loss in the left. Area of cavitation in the left mid lung is present radiographically but better seen on recent CT. Areas of patchy airspace consolidation likewise are felt to be stable on the left. On the right, there is mild patchy opacity in the right base, less apparent compared to recent CT. No new opacity appreciable. No change in cardiac silhouette.   Electronically Signed   By: Lowella Grip III M.D.   On: 12/23/2014 08:45    EKG:   Orders placed or performed during the hospital encounter of 12/20/14  . ED EKG  . ED EKG  . EKG 12-Lead  . EKG 12-Lead    ASSESSMENT AND PLAN:   1. septic shock secondary to bilateral pneumonia: CT of the chest confirmed multifocal pneumonia:   Septic shock resolved.continue iv abx. #2 multifocal pneumonia patient ; continue vancomycin, Zosyn.  pulmonary consult. Continue IV steroids, nebulizers, Spiriva. Added dulera. #3 history of lung cancer ; in remission now.  Slight hyperlipidemia continue statins. 4.Glaucoma; continue eyedrops. #5: Severe malnutrition in the contest of chronic illness' Started on  ensure and also [employer]. Acute respiratory failure;likley due to chf;fluid overload;iv lasix,o2, Hypokalemia;replace k. DNR All the records are reviewed and case discussed with Care Management/Social Workerr. Management plans discussed with the patient, family and they are in agreement.  CODE STATUS: Full Discussed with the patient's daughter.  TOTAL TIME TAKING CARE OF THIS PATIENT: 35 minutes. (critical care)  POSSIBLE D/C IN 4-5  DAYS, DEPENDING ON CLINICAL CONDITION.   Epifanio Lesches M.D  on 12/23/2014 at 9:22 AM  Between 7am to 6pm - Pager - [phone]  After 6pm go to [email or web address] - password [email or web address] [facility]  [facility]  Office  [phone]  CC: Primary care physician; No primary care provider on file.

## 2014-12-23 NOTE — Plan of Care (Signed)
Problem: Phase I Progression Outcomes Goal: OOB as tolerated unless otherwise ordered Outcome: Progressing Encouraged out of bed to recliner for mealts

## 2014-12-24 DIAGNOSIS — A419 Sepsis, unspecified organism: Secondary | ICD-10-CM | POA: Diagnosis not present

## 2014-12-24 LAB — VANCOMYCIN, TROUGH: VANCOMYCIN TR: 9 ug/mL — AB (ref 10–20)

## 2014-12-24 LAB — POTASSIUM: POTASSIUM: 3 mmol/L — AB (ref 3.5–5.1)

## 2014-12-24 MED ORDER — POTASSIUM CHLORIDE 10 MEQ/100ML IV SOLN
10.0000 meq | Freq: Once | INTRAVENOUS | Status: AC
  Administered 2014-12-24: 10 meq via INTRAVENOUS
  Filled 2014-12-24: qty 100

## 2014-12-24 MED ORDER — POTASSIUM CHLORIDE 10 MEQ/100ML IV SOLN
10.0000 meq | INTRAVENOUS | Status: AC
  Administered 2014-12-24 (×3): 10 meq via INTRAVENOUS
  Filled 2014-12-24 (×5): qty 100

## 2014-12-24 MED ORDER — SODIUM CHLORIDE 0.9 % IV SOLN
500.0000 mg | Freq: Once | INTRAVENOUS | Status: AC
  Administered 2014-12-24: 500 mg via INTRAVENOUS
  Filled 2014-12-24: qty 500

## 2014-12-24 MED ORDER — VANCOMYCIN HCL IN DEXTROSE 750-5 MG/150ML-% IV SOLN
750.0000 mg | Freq: Three times a day (TID) | INTRAVENOUS | Status: DC
  Administered 2014-12-25 (×2): 750 mg via INTRAVENOUS
  Filled 2014-12-24 (×5): qty 150

## 2014-12-24 NOTE — Progress Notes (Signed)
Dr. Vianne Bulls okay with running back up fluids with KCl at 50 ml/hr. Wilnette Kales

## 2014-12-24 NOTE — Progress Notes (Signed)
Dr. Vianne Bulls notified that patient Stephen Pollard. Telephone order to give Stephen 10 MEQ IVPB x4. Stephen Pollard

## 2014-12-24 NOTE — Progress Notes (Signed)
ANTIBIOTIC CONSULT NOTE - FOLLOW UP  Pharmacy Consult for Vancomycin/Zosyn Indication: sepsis/BL PNA  No Known Allergies  Patient Measurements: Height: '5\' 3"'$  (160 cm) Weight: 102 lb 11.2 oz (46.584 kg) IBW/kg (Calculated) : 56.9  Vital Signs: Temp: 98.1 F (36.7 C) (07/29 1159) Temp Source: Oral (07/29 1159) BP: 115/61 mmHg (07/29 1159) Pulse Rate: 89 (07/29 1414)  Labs:  Recent Labs  12/22/14 0431 12/23/14 0358  WBC 16.8* 16.4*  HGB 8.4* 8.3*  PLT 255 291  CREATININE 0.47* 0.49*   Estimated Creatinine Clearance: 56.6 mL/min (by C-G formula based on Cr of 0.49).  Recent Labs  12/23/14 [place] 6*     Microbiology: Recent Results (from the past 720 hour(s))  Blood culture (routine x 2)     Status: None (Preliminary result)   Collection Time: 12/20/14  7:16 PM  Result Value Ref Range Status   Specimen Description BLOOD LEFTARM  Final   Special Requests BOTTLES DRAWN AEROBIC AND ANAEROBIC 2ML  Final   Culture NO GROWTH 4 DAYS  Final   Report Status PENDING  Incomplete  Blood culture (routine x 2)     Status: None (Preliminary result)   Collection Time: 12/20/14  7:16 PM  Result Value Ref Range Status   Specimen Description BLOOD R FOREARM  Final   Special Requests BOTTLES DRAWN AEROBIC AND ANAEROBIC 2ML  Final   Culture NO GROWTH 4 DAYS  Final   Report Status PENDING  Incomplete  MRSA PCR Screening     Status: None   Collection Time: 12/20/14  9:35 PM  Result Value Ref Range Status   MRSA by PCR NEGATIVE NEGATIVE Final  Urine culture     Status: None   Collection Time: 12/21/14 12:30 AM  Result Value Ref Range Status   Specimen Description URINE, CLEAN CATCH  Final   Special Requests Normal  Final   Culture MULTIPLE SPECIES PRESENT, SUGGEST RECOLLECTION  Final   Report Status 12/23/2014 FINAL  Final    Anti-infectives    Start     Dose/Rate Route Frequency Ordered Stop   12/24/14 0500  vancomycin (VANCOCIN) 500 mg in sodium chloride 0.9 % 100  mL IVPB     500 mg 100 mL/hr over 60 Minutes Intravenous Every 8 hours 12/23/14 2131     12/21/14 1800  vancomycin (VANCOCIN) 500 mg in sodium chloride 0.9 % 100 mL IVPB  Status:  Discontinued     500 mg 100 mL/hr over 60 Minutes Intravenous Every 18 hours 12/20/14 2205 12/21/14 1646   12/21/14 1415  piperacillin-tazobactam (ZOSYN) IVPB 3.375 g     3.375 g 12.5 mL/hr over 240 Minutes Intravenous Every 8 hours 12/21/14 1130     12/21/14 1400  vancomycin (VANCOCIN) 500 mg in sodium chloride 0.9 % 100 mL IVPB  Status:  Discontinued     500 mg 100 mL/hr over 60 Minutes Intravenous Every 18 hours 12/21/14 1308 12/23/14 2131   12/21/14 1145  vancomycin (VANCOCIN) IVPB 1000 mg/200 mL premix  Status:  Discontinued     1,000 mg 200 mL/hr over 60 Minutes Intravenous Every 24 hours 12/21/14 1130 12/21/14 1308   12/20/14 2215  piperacillin-tazobactam (ZOSYN) IVPB 4.5 g  Status:  Discontinued     4.5 g 200 mL/hr over 30 Minutes Intravenous 3 times per day 12/20/14 2208 12/20/14 2235   12/20/14 2200  cefTRIAXone (ROCEPHIN) 1 g in dextrose 5 % 50 mL IVPB  Status:  Discontinued     1 g 100  mL/hr over 30 Minutes Intravenous Every 24 hours 12/20/14 2147 12/21/14 1130   12/20/14 2145  cefTRIAXone (ROCEPHIN) 1 g in dextrose 5 % 50 mL IVPB - Premix  Status:  Discontinued     1 g 100 mL/hr over 30 Minutes Intravenous Every 24 hours 12/20/14 2137 12/20/14 2146   12/20/14 2145  azithromycin (ZITHROMAX) 500 mg in dextrose 5 % 250 mL IVPB     500 mg 250 mL/hr over 60 Minutes Intravenous Every 24 hours 12/20/14 2137 12/27/14 2144   12/20/14 1900  vancomycin (VANCOCIN) IVPB 1000 mg/200 mL premix     1,000 mg 200 mL/hr over 60 Minutes Intravenous  Once 12/20/14 1846 12/20/14 2111   12/20/14 1900  piperacillin-tazobactam (ZOSYN) IVPB 3.375 g     3.375 g 12.5 mL/hr over 240 Minutes Intravenous  Once 12/20/14 1846 12/20/14 2000      Assessment: 69 y/o M on empiric abx including, vancomycin, Zosyn, and  azithromycin for sepsis due to b/l PNA  Goal of Therapy:  Vancomycin trough level 15-20 mcg/ml  Plan:  Will continue antibiotics at current doses and f/u renal function, levels, and culture results.   7/28:  Vanc trough @ 19:30 = 6 mcg/mL           Will increase dose to Vancomycin 500 mg IV Q8H to start 7/29 @ 5:00.           Will recheck Vanc trough before 3rd new dose on 7/29 @ 20:30.   Chinita Greenland PharmD Clinical Pharmacist 12/24/2014

## 2014-12-24 NOTE — Care Management Important Message (Signed)
Important Message  Patient Details  Name: Stephen Pollard MRN: [id number] Date of Birth: 05/15/1946   Medicare Important Message Given:  Yes-third notification given    Katrina Stack, RN 12/24/2014, 9:41 AM

## 2014-12-24 NOTE — Progress Notes (Signed)
A&O. IV lasix given. Voiding well. IV antibiotics given. No complaints. On 2L O2. Daughter at side. Rested well during the night.

## 2014-12-24 NOTE — Care Management (Signed)
Spoke with patient and wife.  Patient presents from home.  His current 02 requirements are acute.  Discussed the need to continue to assess for need of home 02 during progression.  Patient states that he will not consider any discharge plan other than home.  Both are agreeable to home health and no agency preference.  Physical therapy has upgraded their initial recommendation to home health

## 2014-12-24 NOTE — Progress Notes (Signed)
ANTIBIOTIC CONSULT NOTE - FOLLOW UP  Pharmacy Consult for Vancomycin Indication: sepsis/BL PNA  No Known Allergies  Patient Measurements: Height: '5\' 3"'$  (160 cm) Weight: 102 lb 11.2 oz (46.584 kg) IBW/kg (Calculated) : 56.9   Vital Signs: Temp: 98.3 F (36.8 C) (07/29 1921) Temp Source: Oral (07/29 1921) BP: 106/60 mmHg (07/29 1921) Pulse Rate: 86 (07/29 1921) Intake/Output from previous day: 07/28 0701 - 07/29 0700 In: 1465.8 [P.O.:120; I.V.:295.8; IV Piggyback:1050] Out: 9100 [Urine:9100] Intake/Output from this shift: Total I/O In: 100 [IV Piggyback:100] Out: 500 [Urine:500]  Labs:  Recent Labs  12/22/14 0431 12/23/14 0358  WBC 16.8* 16.4*  HGB 8.4* 8.3*  PLT 255 291  CREATININE 0.47* 0.49*   Estimated Creatinine Clearance: 56.6 mL/min (by C-G formula based on Cr of 0.49).  Recent Labs  12/23/14 1926 12/24/14 2105  [place] 6* 9*     Microbiology: Recent Results (from the past 720 hour(s))  Blood culture (routine x 2)     Status: None (Preliminary result)   Collection Time: 12/20/14  7:16 PM  Result Value Ref Range Status   Specimen Description BLOOD LEFTARM  Final   Special Requests BOTTLES DRAWN AEROBIC AND ANAEROBIC 2ML  Final   Culture NO GROWTH 4 DAYS  Final   Report Status PENDING  Incomplete  Blood culture (routine x 2)     Status: None (Preliminary result)   Collection Time: 12/20/14  7:16 PM  Result Value Ref Range Status   Specimen Description BLOOD R FOREARM  Final   Special Requests BOTTLES DRAWN AEROBIC AND ANAEROBIC 2ML  Final   Culture NO GROWTH 4 DAYS  Final   Report Status PENDING  Incomplete  MRSA PCR Screening     Status: None   Collection Time: 12/20/14  9:35 PM  Result Value Ref Range Status   MRSA by PCR NEGATIVE NEGATIVE Final  Urine culture     Status: None   Collection Time: 12/21/14 12:30 AM  Result Value Ref Range Status   Specimen Description URINE, CLEAN CATCH  Final   Special Requests Normal  Final   Culture  MULTIPLE SPECIES PRESENT, SUGGEST RECOLLECTION  Final   Report Status 12/23/2014 FINAL  Final    Anti-infectives    Start     Dose/Rate Route Frequency Ordered Stop   12/25/14 0500  vancomycin (VANCOCIN) IVPB 750 mg/150 ml premix     750 mg 150 mL/hr over 60 Minutes Intravenous Every 8 hours 12/24/14 2203     12/24/14 2215  vancomycin (VANCOCIN) 500 mg in sodium chloride 0.9 % 100 mL IVPB     500 mg 100 mL/hr over 60 Minutes Intravenous  Once 12/24/14 2203     12/24/14 0500  vancomycin (VANCOCIN) 500 mg in sodium chloride 0.9 % 100 mL IVPB  Status:  Discontinued     500 mg 100 mL/hr over 60 Minutes Intravenous Every 8 hours 12/23/14 2131 12/24/14 2203   12/21/14 1800  vancomycin (VANCOCIN) 500 mg in sodium chloride 0.9 % 100 mL IVPB  Status:  Discontinued     500 mg 100 mL/hr over 60 Minutes Intravenous Every 18 hours 12/20/14 2205 12/21/14 1646   12/21/14 1415  piperacillin-tazobactam (ZOSYN) IVPB 3.375 g     3.375 g 12.5 mL/hr over 240 Minutes Intravenous Every 8 hours 12/21/14 1130     12/21/14 1400  vancomycin (VANCOCIN) 500 mg in sodium chloride 0.9 % 100 mL IVPB  Status:  Discontinued     500 mg 100 mL/hr over 60  Minutes Intravenous Every 18 hours 12/21/14 1308 12/23/14 2131   12/21/14 1145  vancomycin (VANCOCIN) IVPB 1000 mg/200 mL premix  Status:  Discontinued     1,000 mg 200 mL/hr over 60 Minutes Intravenous Every 24 hours 12/21/14 1130 12/21/14 1308   12/20/14 2215  piperacillin-tazobactam (ZOSYN) IVPB 4.5 g  Status:  Discontinued     4.5 g 200 mL/hr over 30 Minutes Intravenous 3 times per day 12/20/14 2208 12/20/14 2235   12/20/14 2200  cefTRIAXone (ROCEPHIN) 1 g in dextrose 5 % 50 mL IVPB  Status:  Discontinued     1 g 100 mL/hr over 30 Minutes Intravenous Every 24 hours 12/20/14 2147 12/21/14 1130   12/20/14 2145  cefTRIAXone (ROCEPHIN) 1 g in dextrose 5 % 50 mL IVPB - Premix  Status:  Discontinued     1 g 100 mL/hr over 30 Minutes Intravenous Every 24 hours 12/20/14  2137 12/20/14 2146   12/20/14 2145  azithromycin (ZITHROMAX) 500 mg in dextrose 5 % 250 mL IVPB     500 mg 250 mL/hr over 60 Minutes Intravenous Every 24 hours 12/20/14 2137 12/27/14 2144   12/20/14 1900  vancomycin (VANCOCIN) IVPB 1000 mg/200 mL premix     1,000 mg 200 mL/hr over 60 Minutes Intravenous  Once 12/20/14 1846 12/20/14 2111   12/20/14 1900  piperacillin-tazobactam (ZOSYN) IVPB 3.375 g     3.375 g 12.5 mL/hr over 240 Minutes Intravenous  Once 12/20/14 1846 12/20/14 2000      Assessment: 69 y/o M on empiric abx including, vancomycin, Zosyn, and azithromycin for sepsis due to b/l PNA   Goal of Therapy:  Vancomycin trough level 15-20 mcg/ml  Plan:  Vancomycin trough remains below goal despite increasing dose to 500 mg iv q 8 h. Trough with the third dose so probably does not yet represent steady state. Will give an additional 500 mg vancomycin then increase dosing to 750 mg iv q 8 h. Will check a trough with the 5th dose of this new regimen so that the level will be at or near steady-state.   Stephen Pollard D 12/24/2014,10:05 PM

## 2014-12-24 NOTE — Progress Notes (Addendum)
Physical Therapy Treatment Patient Details Name: Stephen Pollard MRN: [id number] DOB: [birth year] Today's Date: 12/24/2014    History of Present Illness 69 yo male who had B lung tissue removed 6 years ago for lung CA is admitted with PNA and sepsis     PT Comments    Patient with marked improvement in functional performance and overall activity tolerance this date.  Able to complete all mobility with RW (gait x230') with no greater than cga/min assist.  No significant balance losses or safety concerns noted.   maintaining sats >93% on RA at rest and with exertion (O2 removed per RN).  Left on RA end of session with sats >95%; RN informed/aware.  Wife present and very supportive; available for assist as needed upon discharge. Given noted improvement, discharge plan upgraded to home with HHPT upon discharge.  Patient/family aware and in agreement.  Care management informed/aware.   Follow Up Recommendations  Home health PT     Equipment Recommendations  Rolling walker with 5" wheels    Recommendations for Other Services       Precautions / Restrictions Precautions Precautions: Fall Restrictions Weight Bearing Restrictions: No    Mobility  Bed Mobility Overal bed mobility: Independent                Transfers Overall transfer level: Needs assistance Equipment used: Rolling walker (2 wheeled) Transfers: Sit to/from Stand Sit to Stand: Min guard         General transfer comment: minimal assist from bilat UEs; cga for mild increase in A/P sway  Ambulation/Gait Ambulation/Gait assistance: Min guard Ambulation Distance (Feet): 230 Feet Assistive device: Rolling walker (2 wheeled)     Gait velocity interpretation: <1.8 ft/sec, indicative of risk for recurrent falls General Gait Details: reciprocal stepping witih fair step height/length; fair cadence and gait speed (10' walk time, 7-8 seconds) without buckling or LOB.  Able to complete mild dynamic gait  components without LOB.  Do recommend continued use of RW at this time for optimal safety/stability and overall energy conservation.   Stairs            Wheelchair Mobility    Modified Rankin (Stroke Patients Only)       Balance Overall balance assessment: Needs assistance Sitting-balance support: No upper extremity supported;Feet supported Sitting balance-Leahy Scale: Normal     Standing balance support: Bilateral upper extremity supported Standing balance-Leahy Scale: Fair                      Cognition                            Exercises Other Exercises Other Exercises: Seated LE therex, 1x15, AROM for muscular strength/endurance: ankle pumps, LAQs, marching, hip abduct/adduct. Other Exercises: Sit/stand x3 with and without RW, cga for overall safety.  Able to complete with minimal UE support; recommend RW for optimal safety.    General Comments        Pertinent Vitals/Pain Pain Assessment: No/denies pain    Home Living                      Prior Function            PT Goals (current goals can now be found in the care plan section) Acute Rehab PT Goals Patient Stated Goal: To get up to walk PT Goal Formulation: With patient/family Time For Goal Achievement: 01/05/15 Potential  to Achieve Goals: Good Progress towards PT goals: Progressing toward goals    Frequency  Min 2X/week    PT Plan Discharge plan needs to be updated    Co-evaluation             End of Session Equipment Utilized During Treatment: Gait belt Activity Tolerance: Patient tolerated treatment well Patient left: in bed;with call bell/phone within reach;with bed alarm set;with family/visitor present;with nursing/sitter in room     Time: 0569-7948 PT Time Calculation (min) (ACUTE ONLY): 23 min  Charges:  $Gait Training: 8-22 mins $Therapeutic Exercise: 8-22 mins                    G Codes:      Vincenta Steffey H. Owens Shark, PT, DPT, NCS 12/24/2014,  2:20 PM [phone]

## 2014-12-24 NOTE — Progress Notes (Signed)
[facility] [place] NAME: Stephen Pollard    MR#:  [id number]  DATE OF BIRTH:  1946/03/17  SUBJECTIVE: 69 year old male patient admitted for pneumonia, sepsis due to pneumonia. Patient noted to have shortness of breath, cough and temp was 103.5 fi n the emergency room. Patient initially admitted to ICU for septic shock requiring fluids . patient blood pressure improved and moved to telemetry. Patient feels better today does have little cough but overall feels better. Still appetite is very poor. Hypokalemic. aFebrile   CHIEF COMPLAINT:   Chief Complaint  Patient presents with  . Shortness of Breath    REVIEW OF SYSTEMS:   Review of Systems  Constitutional: Positive for weight loss. Negative for fever and chills.  HENT: Negative for hearing loss.   Eyes: Negative for blurred vision, double vision and photophobia.  Respiratory: Negative for cough, hemoptysis and shortness of breath.   Cardiovascular: Negative for palpitations, orthopnea and leg swelling.  Gastrointestinal: Negative for vomiting, abdominal pain and diarrhea.  Genitourinary: Negative for dysuria and urgency.  Musculoskeletal: Negative for myalgias and neck pain.  Skin: Negative for rash.  Neurological: Negative for dizziness, focal weakness, seizures, weakness and headaches.  Psychiatric/Behavioral: Negative for memory loss. The patient does not have insomnia.    DRUG ALLERGIES:  No Known Allergies  VITALS:  Blood pressure 115/61, pulse 86, temperature 98.1 F (36.7 C), temperature source Oral, resp. rate 18, height '5\' 3"'$  (1.6 m), weight 46.584 kg (102 lb 11.2 oz), SpO2 98 %.  PHYSICAL EXAMINATION:  GENERAL:  69 y.o.-year-old patient lying in the bed with no acute distress. Cachectic. EYES: Pupils equal, round, reactive to light and accommodation. No scleral icterus. Extraocular muscles intact.  HEENT: Head atraumatic, normocephalic. Oropharynx and nasopharynx clear.   NECK:  Supple, no jugular venous distention. No thyroid enlargement, no tenderness.  LUNGS: coarse breath sounds  Present.bilaterally.  CARDIOVASCULAR: S1, S2 normal. No murmurs, rubs, or gallops.  ABDOMEN: Soft, nontender, nondistended. Bowel sounds present. No organomegaly or mass.  EXTREMITIES: No pedal edema, cyanosis, or clubbing.  NEUROLOGIC: Cranial nerves II through XII are intact. Muscle strength 5/5 in all extremities. Sensation intact. Gait not checked.  PSYCHIATRIC: The patient is alert and oriented x 3.  SKIN: No obvious rash, lesion, or ulcer.    LABORATORY PANEL:   CBC  Recent Labs Lab 12/23/14 0358  WBC 16.4*  HGB 8.3*  HCT 25.9*  PLT 291   ------------------------------------------------------------------------------------------------------------------  Chemistries   Recent Labs Lab 12/22/14 0431  12/23/14 0358 12/24/14 0533  NA 141  --  142  --   K 2.9*  < > 3.0* 3.0*  CL 112*  --  115*  --   CO2 26  --  22  --   GLUCOSE 153*  --  114*  --   BUN 8  --  9  --   CREATININE 0.47*  --  0.49*  --   CALCIUM 7.7*  --  7.9*  --   MG  --   --  2.0  --   AST 27  --   --   --   ALT 21  --   --   --   ALKPHOS 72  --   --   --   BILITOT 0.2*  --   --   --   < > = values in this interval not displayed. ------------------------------------------------------------------------------------------------------------------  Cardiac Enzymes  Recent Labs Lab 12/20/14 1915  TROPONINI <0.03   ------------------------------------------------------------------------------------------------------------------  RADIOLOGY:  Dg Chest 1 View  12/23/2014   CLINICAL DATA:  History of lung carcinoma.  Cough and rales  EXAM: CHEST  1 VIEW  COMPARISON:  November 20, 2014 chest radiograph and chest CT December 21, 2014  FINDINGS: There is postoperative change on the left with volume loss. There is shift of the mediastinum toward the left. The area of consolidation with cavitation in the  left mid lung is not felt to be changed but is better seen by CT. Patchy airspace disease in the left lower lobe is stable. There is no new opacity on the left. The right lung is hyperexpanded. There is rather minimal patchy opacity in the right base, less pronounced than on the recent CT. The heart size is normal. Pulmonary vascularity on the right is normal. Pulmonary vascular on the left is distorted by the postoperative change. There is evidence of old rib trauma on the right with remodeling.  IMPRESSION: Postoperative change with volume loss in the left. Area of cavitation in the left mid lung is present radiographically but better seen on recent CT. Areas of patchy airspace consolidation likewise are felt to be stable on the left. On the right, there is mild patchy opacity in the right base, less apparent compared to recent CT. No new opacity appreciable. No change in cardiac silhouette.   Electronically Signed   By: Lowella Grip III M.D.   On: 12/23/2014 08:45    EKG:   Orders placed or performed during the hospital encounter of 12/20/14  . ED EKG  . ED EKG  . EKG 12-Lead  . EKG 12-Lead    ASSESSMENT AND PLAN:   1. septic shock secondary to bilateral pneumonia: CT of the chest confirmed multifocal pneumonia: slowly improving, continue IV antibiotics. Stopped  IV fluids secondary to his shortness of breath and hypoxia which improved with Lasix.  Monitor CBC #2 multifocal pneumonia patient ; continue vancomycin, Zosyn.  pulmonary consult. Continue IV steroids, nebulizers, Spiriva. Added dulera. #3 history of lung cancer ; in remission now.  Slight hyperlipidemia continue statins. 4.Glaucoma; continue eyedrops. #5: Severe malnutrition in the contest of chronic illness' Started on  ensure and also [employer]. Acute respiratory failure;likley due to chf;fluid overload;iv lasix,o2, Hypokalemia;replace k.recheck. DNR PT recommends SNF. All the records are reviewed and  case discussed with Care Management/Social Workerr. Management plans discussed with the patient, family and they are in agreement.  CODE STATUS: DNR Discussed with the patient's daughter.  TOTAL TIME TAKING CARE OF THIS PATIENT: 35 minutes.   POSSIBLE D/C IN 4-5  DAYS, DEPENDING ON CLINICAL CONDITION.   Epifanio Lesches M.D on 12/24/2014 at 12:03 PM  Between 7am to 6pm - Pager - [phone]  After 6pm go to [email or web address] - password [email or web address] [facility]  [facility]  Office  [phone]  CC: Primary care physician; No primary care provider on file.

## 2014-12-24 NOTE — Clinical Social Work Note (Signed)
Clinical Social Work Assessment  Patient Details  Name: Stephen Pollard MRN: [id number] Date of Birth: 05-14-1946  Date of referral:  12/24/14               Reason for consult:  Facility Placement                Permission sought to share information with:  Family Supports Permission granted to share information::  Yes, Verbal Permission Granted  Name::        Agency::     Relationship::     Contact Information:     Housing/Transportation Living arrangements for the past 2 months:   (home) Source of Information:  Patient, Spouse Patient Interpreter Needed:  None Criminal Activity/Legal Involvement Pertinent to Current Situation/Hospitalization:  No - Comment as needed Significant Relationships:    Lives with:  Spouse Do you feel safe going back to the place where you live?  Yes Need for family participation in patient care:  Yes (Comment)  Care giving concerns:  Lives with wife   Social Worker assessment / plan:  CSW met with patient and wife this morning to discuss PT recommendations for STR at discharge. Patient very emaciated looking in appearance. Patient alert and oriented X4 and patient and wife are both preferring to return with home health PT if possible. Patient and wife are in agreement with CSW having the back up plan being STR but are planning on returning home. CSW reiterated that PT had documented patient only ambulated 3 feet day before yesterday and patient and wife acknowledged this but stated patient was independent prior to admission and that the physician has stated he would be here for a few more days and is hoping PT will work with patient while he is here as well. Bedsearch initiated in the event patient and wife change their mind.  Employment status:  Retired Nurse, adult PT Recommendations:  [place] / Referral to community resources:  [place]  Patient/Family's Response to care:   Pleasant and cooperative.  Patient/Family's Understanding of and Emotional Response to Diagnosis, Current Treatment, and Prognosis:  Desire is to return home and they expressed appreciation for CSW visit.  Emotional Assessment Appearance:  Appears older than stated age Attitude/Demeanor/Rapport:   (pleasant and cooperative) Affect (typically observed):  Accepting, Adaptable, Appropriate Orientation:  Oriented to Self, Oriented to Place, Oriented to Situation, Oriented to  Time Alcohol / Substance use:  Not Applicable Psych involvement (Current and /or in the community):  No (Comment)  Discharge Needs  Concerns to be addressed:  Care Coordination Readmission within the last 30 days:  No Current discharge risk:  None Barriers to Discharge:  No Barriers Identified   Shela Leff, LCSW 12/24/2014, 10:15 AM

## 2014-12-24 NOTE — Progress Notes (Signed)
Nutrition Follow-up  DOCUMENTATION CODES:   Severe malnutrition in context of chronic illness  INTERVENTION:  1) Meals and Snacks: Cater to patient preferences; on regular diet 2) Medical Food Supplement Therapy: continue milkshakes made with [employer] Breakfast and icecream; pt really likes these; educated pt and pt's family on how to make a milkshake with [employer], where to buy the supplement and encouraged pt to add things like peaches to his milkshakes for additional kcals (pt loves peach milkshakes)  NUTRITION DIAGNOSIS:   Malnutrition related to poor appetite, altered GI function as evidenced by severe depletion of body fat, severe depletion of muscle mass.  GOAL:   Patient will meet greater than or equal to 90% of their needs  MONITOR:    (Energy Intake, Anthropometrics, Digestive System, Electrolyte/Renal Profile)  REASON FOR ASSESSMENT:   Malnutrition Screening Tool    ASSESSMENT:   Diet Order:  Diet regular Room service appropriate?: Yes; Fluid consistency:: Thin   Energy Intake: reports appetite is good, tolerating current diet; ate 100% at lunch today, 20% at breakfast. Pt drinking Milkshakes made with [employer] and likes.   Electrolyte and Renal Profile:  Recent Labs Lab 12/20/14 1915 12/21/14 0546 12/22/14 0431 12/22/14 0802 12/23/14 0358 12/24/14 0533  BUN 13  --  8  --  9  --   CREATININE 0.62 0.51* 0.47*  --  0.49*  --   NA 133*  --  141  --  142  --   K 3.9  --  2.9* 3.5 3.0* 3.0*  MG  --   --   --   --  2.0  --   PHOS  --   --   --   --  1.3*  --    Glucose Profile: No results for input(s): GLUCAP in the last 72 hours. Protein Profile:  Recent Labs Lab 12/22/14 0431  ALBUMIN 1.8*   Meds: reviewed  Skin:  Reviewed, no issues  Height:   Ht Readings from Last 1 Encounters:  12/20/14 '5\' 3"'$  (1.6 m)    Weight:   Wt Readings from Last 1 Encounters:  12/22/14 102 lb 11.2 oz (46.584 kg)    Filed Weights   12/20/14 1841 12/20/14 2200 12/22/14 1544  Weight: 88 lb 4.8 oz (40.053 kg) 82 lb 7.2 oz (37.4 kg) 102 lb 11.2 oz (46.584 kg)    BMI:  Body mass index is 18.2 kg/(m^2).  Estimated Nutritional Needs:   Kcal:  1102-1117 kcals (BEE 1210, 1.3 AF, 1.0-1.2 IF) using IBW 56 kg  Protein:  56-67 g (1.0-1.2 g/kg)   Fluid:  1680-1960 mL (30-35 ml/kg)    LOW Care Level  Kerman Passey MS, RD, LDN [phone] Pager

## 2014-12-24 NOTE — Progress Notes (Signed)
Patient alert and oriented x4, no complaints at this time. vss at this time. Patient NSR on telemetry. Will continue to assess. Katheline Brendlinger R [place]  

## 2014-12-24 NOTE — Progress Notes (Signed)
Speech Therapy note: reviewed chart notes, consulted pt/family today. Pt is tolerating his regular/mech soft diet and finding foods he can eat w/out much Esophageal discomfort("he likes Hansel Starling from here", per the family). No new opacities or decline in respiratory status per CXR/MD note. No reports of oropharyngeal phase dysphagia per pt. ST will sign off at this time w/ NSG to reconsult if any decline in status. Pt/family agreed.

## 2014-12-25 LAB — CULTURE, BLOOD (ROUTINE X 2)
CULTURE: NO GROWTH
Culture: NO GROWTH

## 2014-12-25 LAB — BASIC METABOLIC PANEL
Anion gap: 5 (ref 5–15)
BUN: 9 mg/dL (ref 6–20)
CALCIUM: 8.3 mg/dL — AB (ref 8.9–10.3)
CHLORIDE: 101 mmol/L (ref 101–111)
CO2: 34 mmol/L — AB (ref 22–32)
CREATININE: 0.59 mg/dL — AB (ref 0.61–1.24)
GFR calc Af Amer: >60 mL/min (ref >60)
GFR calc non Af Amer: >60 mL/min (ref >60)
GLUCOSE: 101 mg/dL — AB (ref 65–99)
Potassium: 3.6 mmol/L (ref 3.5–5.1)
Sodium: 140 mmol/L (ref 135–145)

## 2014-12-25 LAB — CBC
HCT: 29.2 % — ABNORMAL LOW (ref 40.0–52.0)
Hemoglobin: 9.7 g/dL — ABNORMAL LOW (ref 13.0–18.0)
MCH: 28.9 pg (ref 26.0–34.0)
MCHC: 33.2 g/dL (ref 32.0–36.0)
MCV: 87.1 fL (ref 80.0–100.0)
Platelets: 332 10*3/uL (ref 150–440)
RBC: 3.35 MIL/uL — ABNORMAL LOW (ref 4.40–5.90)
RDW: 14.8 % — AB (ref 11.5–14.5)
WBC: 10.5 10*3/uL (ref 3.8–10.6)

## 2014-12-25 MED ORDER — IPRATROPIUM-ALBUTEROL 0.5-2.5 (3) MG/3ML IN SOLN: 3.0000 mL | RESPIRATORY_TRACT | Status: DC | PRN

## 2014-12-25 MED ORDER — CHLORHEXIDINE GLUCONATE 0.12 % MT SOLN
15.0000 mL | Freq: Two times a day (BID) | OROMUCOSAL | Status: DC
  Administered 2014-12-25 – 2014-12-26 (×2): 15 mL via OROMUCOSAL

## 2014-12-25 MED ORDER — LEVOFLOXACIN IN D5W 500 MG/100ML IV SOLN
500.0000 mg | INTRAVENOUS | Status: DC
  Administered 2014-12-25: 500 mg via INTRAVENOUS
  Filled 2014-12-25 (×3): qty 100

## 2014-12-25 MED ORDER — CETYLPYRIDINIUM CHLORIDE 0.05 % MT LIQD: 7.0000 mL | Freq: Two times a day (BID) | OROMUCOSAL | Status: DC

## 2014-12-25 NOTE — Progress Notes (Signed)
A&O. Rested well during the night. Ambien given. Tele box verified. RA. IV antibiotics given. SR on tele.

## 2014-12-25 NOTE — Progress Notes (Signed)
[place] at [place] NAME: Stephen Pollard    MR#:  [id number]  DATE OF BIRTH:  Sep 17, 1945  SUBJECTIVE:  CHIEF COMPLAINT:   Chief Complaint  Patient presents with  . Shortness of Breath   No new complaints. Breathing easily. No nasal cannula. Thin, weak  REVIEW OF SYSTEMS:   Review of Systems  Constitutional: Negative for fever.  Respiratory: Negative for shortness of breath.   Cardiovascular: Negative for chest pain and palpitations.  Gastrointestinal: Negative for nausea, vomiting and abdominal pain.  Genitourinary: Negative for dysuria.    DRUG ALLERGIES:  No Known Allergies  VITALS:  Blood pressure 113/72, pulse 78, temperature 97.6 F (36.4 C), temperature source Oral, resp. rate 20, height '5\' 3"'$  (1.6 m), weight 46.584 kg (102 lb 11.2 oz), SpO2 99 %.  PHYSICAL EXAMINATION:  GENERAL:  69 y.o.-year-old patient lying in the bed with no acute distress. Thin  EYES: Pupils equal, round, reactive to light and accommodation. No scleral icterus. Extraocular muscles intact.  HEENT: Head atraumatic, normocephalic. Oropharynx and nasopharynx clear.  NECK:  Supple, no jugular venous distention. No thyroid enlargement, no tenderness.  LUNGS: Normal breath sounds bilaterally, no wheezing, rales, rhonchi or crepitation. No use of accessory muscles of respiration.  CARDIOVASCULAR: S1, S2 normal. No murmurs, rubs, or gallops.  ABDOMEN: Soft, nontender, nondistended. Bowel sounds present. No organomegaly or mass.  EXTREMITIES: No pedal edema, cyanosis, or clubbing.  NEUROLOGIC: Cranial nerves II through XII are intact. Muscle strength 5/5 in all extremities. Sensation intact. Gait not checked.  PSYCHIATRIC: The patient is alert and oriented x 3.  SKIN: No obvious rash, lesion, or ulcer.    LABORATORY PANEL:   CBC  Recent Labs Lab 12/25/14 0539  WBC 10.5  HGB 9.7*  HCT 29.2*  PLT 332    ------------------------------------------------------------------------------------------------------------------  Chemistries   Recent Labs Lab 12/22/14 0431  12/23/14 0358  12/25/14 0539  NA 141  --  142  --  140  K 2.9*  < > 3.0*  < > 3.6  CL 112*  --  115*  --  101  CO2 26  --  22  --  34*  GLUCOSE 153*  --  114*  --  101*  BUN 8  --  9  --  9  CREATININE 0.47*  --  0.49*  --  0.59*  CALCIUM 7.7*  --  7.9*  --  8.3*  MG  --   --  2.0  --   --   AST 27  --   --   --   --   ALT 21  --   --   --   --   ALKPHOS 72  --   --   --   --   BILITOT 0.2*  --   --   --   --   < > = values in this interval not displayed. ------------------------------------------------------------------------------------------------------------------  Cardiac Enzymes  Recent Labs Lab 12/20/14 1915  TROPONINI <0.03   ------------------------------------------------------------------------------------------------------------------  RADIOLOGY:  No results found.  EKG:   Orders placed or performed during the hospital encounter of 12/20/14  . ED EKG  . ED EKG  . EKG 12-Lead  . EKG 12-Lead    ASSESSMENT AND PLAN:   #1 septic shock: Likely due to bilateral pneumonia. Initially admitted to the ICU with hypotension. Needed 24 hours of Levothroid. Now blood pressure is improved shock resolved  #2 multifocal pneumonia: Discontinue azithromycin, vancomycin and  Zosyn has received 5 days. Start Levaquin. Cultures negative.respiratory status improved.  #3 history of lung cancer colon in remission. CT scan with no evidence of recurrence.  #4 acute respiratory failure: Resolved. Due to pneumonia and also volume overload. Now on room air.  #5 deconditioning: Initial recommendation by physical therapy was for skilled nursing. Currently ambulating fairly well and will likely discharge home with home health.  All the records are reviewed and case discussed with Care Management/Social  Workerr. Management plans discussed with the patient, family and they are in agreement.  CODE STATUS: dnr  TOTAL TIME TAKING CARE OF THIS PATIENT: 30 minutes.  Greater than 50% of time spent in care coordination and counseling. POSSIBLE D/C IN 1-2 DAYS, DEPENDING ON CLINICAL CONDITION.   Myrtis Ser M.D on 12/25/2014 at 1:05 PM  Between 7am to 6pm - Pager - [phone]  After 6pm go to [email or web address] - password [email or web address] [facility]  [place] [facility]  Office  [phone]  CC: Primary care physician; No primary care provider on file.

## 2014-12-26 LAB — BASIC METABOLIC PANEL
ANION GAP: 6 (ref 5–15)
BUN: 9 mg/dL (ref 6–20)
CALCIUM: 8.4 mg/dL — AB (ref 8.9–10.3)
CHLORIDE: 101 mmol/L (ref 101–111)
CO2: 33 mmol/L — AB (ref 22–32)
Creatinine, Ser: 0.46 mg/dL — ABNORMAL LOW (ref 0.61–1.24)
GFR calc Af Amer: >60 mL/min (ref >60)
GFR calc non Af Amer: >60 mL/min (ref >60)
Glucose, Bld: 86 mg/dL (ref 65–99)
Potassium: 3.9 mmol/L (ref 3.5–5.1)
Sodium: 140 mmol/L (ref 135–145)

## 2014-12-26 LAB — CBC
HCT: 31 % — ABNORMAL LOW (ref 40.0–52.0)
Hemoglobin: 10.1 g/dL — ABNORMAL LOW (ref 13.0–18.0)
MCH: 28.3 pg (ref 26.0–34.0)
MCHC: 32.5 g/dL (ref 32.0–36.0)
MCV: 87.1 fL (ref 80.0–100.0)
PLATELETS: 372 10*3/uL (ref 150–440)
RBC: 3.56 MIL/uL — ABNORMAL LOW (ref 4.40–5.90)
RDW: 15.4 % — AB (ref 11.5–14.5)
WBC: 9.9 10*3/uL (ref 3.8–10.6)

## 2014-12-26 MED ORDER — FAMOTIDINE 20 MG PO TABS
20.0000 mg | ORAL_TABLET | Freq: Two times a day (BID) | ORAL | Status: DC
  Administered 2014-12-26 – 2014-12-27 (×2): 20 mg via ORAL
  Filled 2014-12-26 (×2): qty 1

## 2014-12-26 MED ORDER — LEVOFLOXACIN 500 MG PO TABS
500.0000 mg | ORAL_TABLET | Freq: Every day | ORAL | Status: DC
  Administered 2014-12-26: 500 mg via ORAL
  Filled 2014-12-26: qty 1

## 2014-12-26 NOTE — Progress Notes (Signed)
Patient alert and oriented x4, no complaints at this time. vss at this time. Patient NSR on telemetry. Will continue to assess. Patient ambulated 2 laps around unit without SOB.  Stephen Pollard

## 2014-12-26 NOTE — Progress Notes (Signed)
[place] at [place] NAME: Stephen Pollard    MR#:  [id number]  DATE OF BIRTH:  Apr 03, 1946  SUBJECTIVE:  CHIEF COMPLAINT:   Chief Complaint  Patient presents with  . Shortness of Breath   No new complaints. Breathing easily. No nasal cannula. Thin, weak. Eating lunch. Family is present.  REVIEW OF SYSTEMS:   Review of Systems  Constitutional: Negative for fever.  Respiratory: Negative for shortness of breath.   Cardiovascular: Negative for chest pain and palpitations.  Gastrointestinal: Negative for nausea, vomiting and abdominal pain.  Genitourinary: Negative for dysuria.    DRUG ALLERGIES:  No Known Allergies  VITALS:  Blood pressure 132/78, pulse 87, temperature 97.8 F (36.6 C), temperature source Oral, resp. rate 18, height '5\' 3"'$  (1.6 m), weight 46.584 kg (102 lb 11.2 oz), SpO2 100 %.  PHYSICAL EXAMINATION:  GENERAL:  69 y.o.-year-old patient lying in the bed with no acute distress. Thin  EYES: Pupils equal, round, reactive to light and accommodation. No scleral icterus. Extraocular muscles intact.  HEENT: Head atraumatic, normocephalic. Oropharynx and nasopharynx clear.  NECK:  Supple, no jugular venous distention. No thyroid enlargement, no tenderness.  LUNGS: Normal breath sounds bilaterally, no wheezing, rales, rhonchi or crepitation. No use of accessory muscles of respiration.  CARDIOVASCULAR: S1, S2 normal. No murmurs, rubs, or gallops.  ABDOMEN: Soft, nontender, nondistended. Bowel sounds present. No organomegaly or mass.  EXTREMITIES: No pedal edema, cyanosis, or clubbing.  NEUROLOGIC: Cranial nerves II through XII are intact. Muscle strength 5/5 in all extremities. Sensation intact. Gait not checked.  PSYCHIATRIC: The patient is alert and oriented x 3.  SKIN: No obvious rash, lesion, or ulcer.    LABORATORY PANEL:   CBC  Recent Labs Lab 12/26/14 0515  WBC 9.9  HGB 10.1*  HCT 31.0*  PLT 372    ------------------------------------------------------------------------------------------------------------------  Chemistries   Recent Labs Lab 12/22/14 0431  12/23/14 0358  12/26/14 0515  NA 141  --  142  < > 140  K 2.9*  < > 3.0*  < > 3.9  CL 112*  --  115*  < > 101  CO2 26  --  22  < > 33*  GLUCOSE 153*  --  114*  < > 86  BUN 8  --  9  < > 9  CREATININE 0.47*  --  0.49*  < > 0.46*  CALCIUM 7.7*  --  7.9*  < > 8.4*  MG  --   --  2.0  --   --   AST 27  --   --   --   --   ALT 21  --   --   --   --   ALKPHOS 72  --   --   --   --   BILITOT 0.2*  --   --   --   --   < > = values in this interval not displayed. ------------------------------------------------------------------------------------------------------------------  Cardiac Enzymes  Recent Labs Lab 12/20/14 1915  TROPONINI <0.03   ------------------------------------------------------------------------------------------------------------------  RADIOLOGY:  No results found.  EKG:   Orders placed or performed during the hospital encounter of 12/20/14  . ED EKG  . ED EKG  . EKG 12-Lead  . EKG 12-Lead    ASSESSMENT AND PLAN:   #1 septic shock: Likely due to bilateral pneumonia. Initially admitted to the ICU with hypotension. Needed 24 hours of Levophed. Now blood pressure is improved shock resolved  #2 multifocal  pneumonia: Discontinue azithromycin, vancomycin and Zosyn after 5 days. Start Levaquin now day 2 of 5. Cultures negative.respiratory status improved.  #3 history of lung cancer colon in remission. CT scan with no evidence of recurrence.  #4 acute respiratory failure: Resolved. Due to pneumonia and also volume overload. Now on room air.  #5 deconditioning: Initial recommendation by physical therapy was for skilled nursing. Currently ambulating fairly well and will likely discharge home with home health.  All the records are reviewed and case discussed with Care Management/Social  Workerr. Management plans discussed with the patient, family and they are in agreement.  CODE STATUS: dnr  TOTAL TIME TAKING CARE OF THIS PATIENT: 30 minutes.  Greater than 50% of time spent in care coordination and counseling. POSSIBLE D/C IN 1-2 DAYS, DEPENDING ON CLINICAL CONDITION.   Myrtis Ser M.D on 12/26/2014 at 1:53 PM  Between 7am to 6pm - Pager - [phone]  After 6pm go to [email or web address] - password [email or web address] [facility]  [place] [facility]  Office  [phone]  CC: Primary care physician; No primary care provider on file.

## 2014-12-27 LAB — BASIC METABOLIC PANEL
Anion gap: 5 (ref 5–15)
BUN: 13 mg/dL (ref 6–20)
CHLORIDE: 99 mmol/L — AB (ref 101–111)
CO2: 34 mmol/L — ABNORMAL HIGH (ref 22–32)
Calcium: 8.6 mg/dL — ABNORMAL LOW (ref 8.9–10.3)
Creatinine, Ser: 0.61 mg/dL (ref 0.61–1.24)
GFR calc Af Amer: >60 mL/min (ref >60)
GFR calc non Af Amer: >60 mL/min (ref >60)
Glucose, Bld: 89 mg/dL (ref 65–99)
Potassium: 4.4 mmol/L (ref 3.5–5.1)
Sodium: 138 mmol/L (ref 135–145)

## 2014-12-27 MED ORDER — LEVOFLOXACIN 500 MG PO TABS: 500.0000 mg | ORAL_TABLET | Freq: Every day | ORAL | Status: DC

## 2014-12-27 MED ORDER — PREDNISONE 20 MG PO TABS: ORAL_TABLET | ORAL | Status: DC

## 2014-12-27 MED ORDER — MOMETASONE FURO-FORMOTEROL FUM 200-5 MCG/ACT IN AERO: 2.0000 | INHALATION_SPRAY | Freq: Two times a day (BID) | RESPIRATORY_TRACT | Status: AC

## 2014-12-27 MED ORDER — TIOTROPIUM BROMIDE MONOHYDRATE 18 MCG IN CAPS: 18.0000 ug | ORAL_CAPSULE | Freq: Every day | RESPIRATORY_TRACT | Status: DC

## 2014-12-27 NOTE — Discharge Summary (Signed)
[place] at [place]   PATIENT NAME: Stephen Pollard    MR#:  [id number]  DATE OF BIRTH:  1946-02-18  DATE OF ADMISSION:  12/20/2014 ADMITTING PHYSICIAN: Max Sane, MD  DATE OF DISCHARGE: 12/27/14  PRIMARY CARE PHYSICIAN: No primary care provider on file.    ADMISSION DIAGNOSIS:  Community acquired pneumonia [J18.9] SIRS (systemic inflammatory response syndrome) [A41.9]  DISCHARGE DIAGNOSIS:  Active Problems:   Sepsis   Protein-calorie malnutrition, severe   SECONDARY DIAGNOSIS:   Past Medical History  Diagnosis Date  . [place] COURSE:   Problem #1 septic shock due to severe bilateral pneumonia. Blood cultures negative. On admission he was placed in the ICU and started on broad-spectrum anti-biotics including Rocephin and Zithromax and vancomycin. He was treated with the sepsis protocol with aggressive hydration and had to start pressors, Levophed briefly. He was transferred to telemetry after 48 hours in the ICU. At the time of discharge he is normotensive.  #2 multifocal pneumonia: He was treated with azithromycin and vancomycin and Zosyn for 5 days. This was discontinued and he is now completing an additional 5 days of Levaquin. Cultures have been negative. Respiratory status has improved and he does not require supplemental oxygen.  #3 history of lung cancer in remission: CT scan shows no evidence of recurrence.  #4 acute respiratory failure with hypoxia: Initially required supplemental oxygen, with treatment of pneumonia he has done very well. He is now on room air does not require supplemental oxygen even with ambulation.  #5 deconditioning and protein calorie malnutrition: Patient has a good appetite he was seen by nutrition service during hospitalization and was encouraged to increase his caloric intake with ice cream and Carnation instant breakfast. He will also have home health  nursing and physical therapy upon discharge.  DISCHARGE CONDITIONS:   Fair  CONSULTS OBTAINED:    Pulmonary critical care, Dr. Mortimer Fries  DRUG ALLERGIES:  No Known Allergies  DISCHARGE MEDICATIONS:   Discharge Medication List as of 12/27/2014 12:40 PM    START taking these medications   Details  levofloxacin (LEVAQUIN) 500 MG tablet Take 1 tablet (500 mg total) by mouth daily., Starting 12/27/2014, Until Discontinued, Print    mometasone-formoterol (DULERA) 200-5 MCG/ACT AERO Inhale 2 puffs into the lungs 2 (two) times daily., Starting 12/27/2014, Until Discontinued, Print    predniSONE (DELTASONE) 20 MG tablet Take 2 tablets daily for 3 days and then one tablet daily for 3 days and then stop., Print    tiotropium (SPIRIVA) 18 MCG inhalation capsule Place 1 capsule (18 mcg total) into inhaler and inhale daily., Starting 12/27/2014, Until Discontinued, Normal      CONTINUE these medications which have NOT CHANGED   Details  benzonatate (TESSALON) 100 MG capsule Take 100 mg by mouth 3 (three) times daily as needed for cough., Until Discontinued, Historical Med    cetirizine (ZYRTEC) 10 MG tablet Take 10 mg by mouth at bedtime. Pt took two tablets this morning.  (12/20/14), Until Discontinued, Historical Med    guaiFENesin-codeine (ROBITUSSIN AC) 100-10 MG/5ML syrup Take 5 mLs by mouth at bedtime as needed for cough. , Until Discontinued, Historical Med    rosuvastatin (CRESTOR) 5 MG tablet Take 5 mg by mouth at bedtime. , Until Discontinued, Historical Med    timolol (BETIMOL) 0.5 % ophthalmic solution Place 1 drop into both eyes at bedtime., Until Discontinued, Historical Med  DISCHARGE INSTRUCTIONS:     DIET:  Regular diet  DISCHARGE CONDITION:  Fair  ACTIVITY:  Activity as tolerated  OXYGEN:  Home Oxygen: No.   Oxygen Delivery: room air  DISCHARGE LOCATION:  home    If you experience worsening of your admission symptoms, develop shortness of breath, life  threatening emergency, suicidal or homicidal thoughts you must seek medical attention immediately by calling 911 or calling your MD immediately  if symptoms less severe.  You Must read complete instructions/literature along with all the possible adverse reactions/side effects for all the Medicines you take and that have been prescribed to you. Take any new Medicines after you have completely understood and accept all the possible adverse reactions/side effects.   Please note  You were cared for by a hospitalist during your hospital stay. If you have any questions about your discharge medications or the care you received while you were in the hospital after you are discharged, you can call the unit and asked to speak with the hospitalist on call if the hospitalist that took care of you is not available. Once you are discharged, your primary care physician will handle any further medical issues. Please note that NO REFILLS for any discharge medications will be authorized once you are discharged, as it is imperative that you return to your primary care physician (or establish a relationship with a primary care physician if you do not have one) for your aftercare needs so that they can reassess your need for medications and monitor your lab values.    Today   CHIEF COMPLAINT:   Chief Complaint  Patient presents with  . Shortness of Breath   No complaints on day of discharge. Doing well. Eating well. Ambulating around the nurse's station.   HISTORY OF PRESENT ILLNESS:  Stephen Pollard is a 69 y.o. male with a known history of COPD and stage I lung cancer first diagnosed in 2010 status post partial lung resection followed by Dr. Grayland Ormond is being admitted for sepsis likely due to pneumonia. Patient has been having intermittent fever with cough and shortness of breath for last 3 weeks. Has been treated by his primary care physician with Biaxin XL 1000 mg daily for 2 weeks without much relief.  Patient was seen by his primary care physician last week and was given Tessalon Perles and no antibiotics. Patient reports continued cough but no fever. On 6 PM today he got extremely short of breath with constant coughing and could not manage at home. Called EMS and was brought down to the emergency department. While in the ED he was noted to have pneumonia on chest x-ray and criteria meeting sepsis based on vitals. He spiked a temperature up to 103.5. While in the emergency department and his heart rate was running anywhere from 110 to 140s with a respiratory rate anywhere from 24-30s. Also reports gradual weight loss of about 6-8 pounds over last 6-8 months 2 pounds in last 2 weeks. All family is at bedside. He denies any hemoptysis. He was released from [facility] last October after serial CT scans and follow-ups and was declared cancer free per patient.  VITAL SIGNS:  Blood pressure 116/79, pulse 75, temperature 98.4 F (36.9 C), temperature source Oral, resp. rate 20, height '5\' 3"'$  (1.6 m), weight 46.584 kg (102 lb 11.2 oz), SpO2 98 %.  I/O:   Intake/Output Summary (Last 24 hours) at 12/27/14 1410 Last data filed at 12/27/14 0929  Gross per 24 hour  Intake  240 ml  Output   1325 ml  Net  -1085 ml    PHYSICAL EXAMINATION:  GENERAL: 69 y.o.-year-old patient lying in the bed with no acute distress. Thin  EYES: Pupils equal, round, reactive to light and accommodation. No scleral icterus. Extraocular muscles intact.  HEENT: Head atraumatic, normocephalic. Oropharynx and nasopharynx clear.  NECK: Supple, no jugular venous distention. No thyroid enlargement, no tenderness.  LUNGS: Normal breath sounds bilaterally, no wheezing, rales, rhonchi or crepitation. No use of accessory muscles of respiration.  CARDIOVASCULAR: S1, S2 normal. No murmurs, rubs, or gallops.  ABDOMEN: Soft, nontender, nondistended. Bowel sounds present. No organomegaly or mass.  EXTREMITIES: No pedal  edema, cyanosis, or clubbing.  NEUROLOGIC: Cranial nerves II through XII are intact. Muscle strength 5/5 in all extremities. Sensation intact. Gait not checked.  PSYCHIATRIC: The patient is alert and oriented x 3.  SKIN: No obvious rash, lesion, or ulcer.   DATA REVIEW:   CBC  Recent Labs Lab 12/26/14 0515  WBC 9.9  HGB 10.1*  HCT 31.0*  PLT 372    Chemistries   Recent Labs Lab 12/22/14 0431  12/23/14 0358  12/27/14 0352  NA 141  --  142  < > 138  K 2.9*  < > 3.0*  < > 4.4  CL 112*  --  115*  < > 99*  CO2 26  --  22  < > 34*  GLUCOSE 153*  --  114*  < > 89  BUN 8  --  9  < > 13  CREATININE 0.47*  --  0.49*  < > 0.61  CALCIUM 7.7*  --  7.9*  < > 8.6*  MG  --   --  2.0  --   --   AST 27  --   --   --   --   ALT 21  --   --   --   --   ALKPHOS 72  --   --   --   --   BILITOT 0.2*  --   --   --   --   < > = values in this interval not displayed.  Cardiac Enzymes  Recent Labs Lab 12/20/14 1915  TROPONINI <0.03    Microbiology Results  Results for orders placed or performed during the hospital encounter of 12/20/14  Blood culture (routine x 2)     Status: None   Collection Time: 12/20/14  7:16 PM  Result Value Ref Range Status   Specimen Description BLOOD LEFTARM  Final   Special Requests BOTTLES DRAWN AEROBIC AND ANAEROBIC 2ML  Final   Culture NO GROWTH 5 DAYS  Final   Report Status 12/25/2014 FINAL  Final  Blood culture (routine x 2)     Status: None   Collection Time: 12/20/14  7:16 PM  Result Value Ref Range Status   Specimen Description BLOOD R FOREARM  Final   Special Requests BOTTLES DRAWN AEROBIC AND ANAEROBIC 2ML  Final   Culture NO GROWTH 5 DAYS  Final   Report Status 12/25/2014 FINAL  Final  MRSA PCR Screening     Status: None   Collection Time: 12/20/14  9:35 PM  Result Value Ref Range Status   MRSA by PCR NEGATIVE NEGATIVE Final  Urine culture     Status: None   Collection Time: 12/21/14 12:30 AM  Result Value Ref Range Status    Specimen Description URINE, CLEAN CATCH  Final   Special Requests Normal  Final  Culture MULTIPLE SPECIES PRESENT, SUGGEST RECOLLECTION  Final   Report Status 12/23/2014 FINAL  Final    RADIOLOGY:  No results found.  EKG:   Orders placed or performed during the hospital encounter of 12/20/14  . ED EKG  . ED EKG  . EKG 12-Lead  . EKG 12-Lead      Management plans discussed with the patient, family and they are in agreement.  CODE STATUS:     Code Status Orders        Start     Ordered   12/20/14 2054  Do not attempt resuscitation (DNR)   Continuous    Question Answer Comment  In the event of cardiac or respiratory ARREST Do not call a "code blue"   In the event of cardiac or respiratory ARREST Do not perform Intubation, CPR, defibrillation or ACLS   In the event of cardiac or respiratory ARREST Use medication by any route, position, wound care, and other measures to relive pain and suffering. May use oxygen, suction and manual treatment of airway obstruction as needed for comfort.      12/20/14 2054      TOTAL TIME TAKING CARE OF THIS PATIENT: 35 minutes.  Greater than 50% of time spent in care coordination and counseling. Care plan discussed with patient, family, care management.  Myrtis Ser M.D on 12/27/2014 at 2:10 PM  Between 7am to 6pm - Pager - [phone]  After 6pm go to [email or web address] - password [email or web address] [facility]  [place] [facility]  Office  [phone]  CC: Primary care physician; No primary care provider on file.

## 2014-12-27 NOTE — Progress Notes (Signed)
Patient d/c'd home with HH. Education provided, no questions at this time. Patient to be picked up by wife. Telemetry removed. Wilnette Kales

## 2014-12-27 NOTE — Care Management (Signed)
Patient is not going to require home 02.  He and his wife are a on agreement with home health nursing and physical therapy and front wheeled rolling walker.   No agency preference.  Referral to [facility].  Patient is current with his PCP- Dr Kandice Robinsons at [facility] at [facility]

## 2014-12-27 NOTE — Discharge Instructions (Signed)
°  DIET:  Regular diet  DISCHARGE CONDITION:  Fair  ACTIVITY:  Activity as tolerated  OXYGEN:  Home Oxygen: No.   Oxygen Delivery: room air  DISCHARGE LOCATION:  home   If you experience worsening of your admission symptoms, develop shortness of breath, life threatening emergency, suicidal or homicidal thoughts you must seek medical attention immediately by calling 911 or calling your MD immediately  if symptoms less severe.  You Must read complete instructions/literature along with all the possible adverse reactions/side effects for all the Medicines you take and that have been prescribed to you. Take any new Medicines after you have completely understood and accpet all the possible adverse reactions/side effects.   Please note  You were cared for by a hospitalist during your hospital stay. If you have any questions about your discharge medications or the care you received while you were in the hospital after you are discharged, you can call the unit and asked to speak with the hospitalist on call if the hospitalist that took care of you is not available. Once you are discharged, your primary care physician will handle any further medical issues. Please note that NO REFILLS for any discharge medications will be authorized once you are discharged, as it is imperative that you return to your primary care physician (or establish a relationship with a primary care physician if you do not have one) for your aftercare needs so that they can reassess your need for medications and monitor your lab values.   [place] [facility]-  [phone]

## 2014-12-27 NOTE — Care Management Important Message (Signed)
Important Message  Patient Details  Name: Stephen Pollard MRN: [id number] Date of Birth: [birth year]   Medicare Important Message Given:  Yes-fourth notification given    Juliann Pulse A Allmond 12/27/2014, 10:08 AM

## 2014-12-27 NOTE — Progress Notes (Signed)
A&O. Up with assist. PO antibiotics given. No complaints. Slept well during the night. ON Room air.

## 2015-01-28 ENCOUNTER — Other Ambulatory Visit: Payer: Self-pay | Admitting: Specialist

## 2015-01-28 DIAGNOSIS — J984 Other disorders of lung: Secondary | ICD-10-CM

## 2015-01-28 DIAGNOSIS — R0602 Shortness of breath: Secondary | ICD-10-CM

## 2015-03-14 ENCOUNTER — Ambulatory Visit
Admission: RE | Admit: 2015-03-14 | Discharge: 2015-03-14 | Disposition: A | Discharge status: Home / Self Care | Payer: Medicare Other | Source: Ambulatory Visit | Attending: Specialist | Admitting: Specialist

## 2015-03-14 DIAGNOSIS — I2510 Atherosclerotic heart disease of native coronary artery without angina pectoris: Secondary | ICD-10-CM | POA: Insufficient documentation

## 2015-03-14 DIAGNOSIS — J984 Other disorders of lung: Secondary | ICD-10-CM | POA: Diagnosis present

## 2015-03-14 DIAGNOSIS — J90 Pleural effusion, not elsewhere classified: Secondary | ICD-10-CM | POA: Diagnosis not present

## 2015-03-14 DIAGNOSIS — J439 Emphysema, unspecified: Secondary | ICD-10-CM | POA: Diagnosis not present

## 2015-03-14 DIAGNOSIS — J189 Pneumonia, unspecified organism: Secondary | ICD-10-CM | POA: Insufficient documentation

## 2015-03-14 DIAGNOSIS — R05 Cough: Secondary | ICD-10-CM | POA: Diagnosis present

## 2015-03-14 DIAGNOSIS — R0602 Shortness of breath: Secondary | ICD-10-CM | POA: Diagnosis present

## 2015-10-26 ENCOUNTER — Other Ambulatory Visit: Payer: Self-pay | Admitting: Family Medicine

## 2015-10-26 DIAGNOSIS — Z859 Personal history of malignant neoplasm, unspecified: Secondary | ICD-10-CM | POA: Insufficient documentation

## 2015-10-26 DIAGNOSIS — Z9889 Other specified postprocedural states: Secondary | ICD-10-CM | POA: Insufficient documentation

## 2015-10-26 DIAGNOSIS — R634 Abnormal weight loss: Secondary | ICD-10-CM

## 2015-10-26 DIAGNOSIS — Z8619 Personal history of other infectious and parasitic diseases: Secondary | ICD-10-CM | POA: Insufficient documentation

## 2015-10-28 ENCOUNTER — Ambulatory Visit
Admission: RE | Admit: 2015-10-28 | Discharge: 2015-10-28 | Disposition: A | Discharge status: Home / Self Care | Payer: Medicare Other | Source: Ambulatory Visit | Attending: Family Medicine | Admitting: Family Medicine

## 2015-10-28 DIAGNOSIS — R634 Abnormal weight loss: Secondary | ICD-10-CM | POA: Diagnosis present

## 2015-10-28 DIAGNOSIS — Z85118 Personal history of other malignant neoplasm of bronchus and lung: Secondary | ICD-10-CM | POA: Diagnosis present

## 2015-10-28 DIAGNOSIS — R918 Other nonspecific abnormal finding of lung field: Secondary | ICD-10-CM | POA: Insufficient documentation

## 2015-10-28 DIAGNOSIS — I2510 Atherosclerotic heart disease of native coronary artery without angina pectoris: Secondary | ICD-10-CM | POA: Diagnosis not present

## 2015-10-28 MED ORDER — IOPAMIDOL (ISOVUE-370) INJECTION 76%
75.0000 mL | Freq: Once | INTRAVENOUS | Status: AC | PRN
  Administered 2015-10-28: 75 mL via INTRAVENOUS

## 2015-11-08 ENCOUNTER — Inpatient Hospital Stay: Payer: Medicare Other | Attending: Oncology | Admitting: Oncology

## 2015-11-08 VITALS — BP 111/66 | HR 105 | Temp 98.1°F | Wt 81.3 lb

## 2015-11-08 DIAGNOSIS — Z87891 Personal history of nicotine dependence: Secondary | ICD-10-CM | POA: Diagnosis not present

## 2015-11-08 DIAGNOSIS — Z79899 Other long term (current) drug therapy: Secondary | ICD-10-CM

## 2015-11-08 DIAGNOSIS — R918 Other nonspecific abnormal finding of lung field: Secondary | ICD-10-CM | POA: Insufficient documentation

## 2015-11-08 DIAGNOSIS — Z85118 Personal history of other malignant neoplasm of bronchus and lung: Secondary | ICD-10-CM

## 2015-11-08 DIAGNOSIS — R05 Cough: Secondary | ICD-10-CM | POA: Insufficient documentation

## 2015-11-08 DIAGNOSIS — Z792 Long term (current) use of antibiotics: Secondary | ICD-10-CM | POA: Diagnosis not present

## 2015-11-08 DIAGNOSIS — J449 Chronic obstructive pulmonary disease, unspecified: Secondary | ICD-10-CM | POA: Diagnosis not present

## 2015-11-08 DIAGNOSIS — C3490 Malignant neoplasm of unspecified part of unspecified bronchus or lung: Secondary | ICD-10-CM | POA: Insufficient documentation

## 2015-11-08 DIAGNOSIS — N320 Bladder-neck obstruction: Secondary | ICD-10-CM | POA: Insufficient documentation

## 2015-11-08 DIAGNOSIS — E785 Hyperlipidemia, unspecified: Secondary | ICD-10-CM | POA: Insufficient documentation

## 2015-11-08 DIAGNOSIS — H409 Unspecified glaucoma: Secondary | ICD-10-CM | POA: Insufficient documentation

## 2015-11-15 NOTE — Progress Notes (Signed)
[place]  [phone] [phone]) [phone]  ID: Stephen Pollard OB: 02-21-1946  MR#: [id number]  QJJ#:[id number]  Patient Care Team: Sherrin Daisy, MD as PCP - General (Family Medicine)  CHIEF COMPLAINT:  Chief Complaint  Patient presents with  . malignant neoplasm of upper rt lobe    INTERVAL HISTORY: Patient is a 70 year old male whose last evaluated in clinic in October 2015. 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 He is referred back after having CT scan concerning for recurrence. Patient has also had cough and congestion concerning for an infectious process and is currently on antibiotics. He otherwise feels well. He has no neurologic complaints. He denies any fevers. He has no chest pain. He has a fair appetite, but denies any weight loss. He has no nausea, vomiting, constipation, or diarrhea. He has no urinary complaints. Patient otherwise feels well and offers no further specific complaints.  REVIEW OF SYSTEMS:   Review of Systems  Constitutional: Negative.  Negative for fever, weight loss and malaise/fatigue.  Respiratory: Positive for cough. Negative for hemoptysis and shortness of breath.   Cardiovascular: Negative.  Negative for chest pain.  Gastrointestinal: Negative.  Negative for abdominal pain.  Genitourinary: Negative.   Musculoskeletal: Negative.   Neurological: Negative.  Negative for weakness.  Psychiatric/Behavioral: Negative.     As per HPI. Otherwise, a complete review of systems is negatve.  PAST MEDICAL HISTORY: Past Medical History  Diagnosis Date  . Cancer     Lung Cancer    PAST SURGICAL HISTORY: Past Surgical History  Procedure Laterality Date  . Lung removal, partial Bilateral   . Cholecystectomy    . Knee surgery Left     FAMILY HISTORY: Reviewed and unchanged. No reported history of malignancy or chronic disease.     ADVANCED DIRECTIVES:     HEALTH MAINTENANCE: Social History  Substance Use Topics  . Smoking status: Former Research scientist (life sciences)  . Smokeless tobacco: Not on file  . Alcohol Use: Yes     Comment: occasionally     Colonoscopy:  PAP:  Bone density:  Lipid panel:  No Known Allergies  Current Outpatient Prescriptions  Medication Sig Dispense Refill  . budesonide-formoterol (SYMBICORT) 160-4.5 MCG/ACT inhaler Inhale into the lungs.    . cetirizine (ZYRTEC) 10 MG tablet Take by mouth.    Marland Kitchen guaiFENesin-codeine (ROBITUSSIN AC) 100-10 MG/5ML syrup Take by mouth.    . levofloxacin (LEVAQUIN) 750 MG tablet     . mometasone-formoterol (DULERA) 200-5 MCG/ACT AERO Inhale 2 puffs into the lungs 2 (two) times daily. 1 Inhaler 1  . rosuvastatin (CRESTOR) 5 MG tablet Take by mouth.    . timolol (BETIMOL) 0.5 % ophthalmic solution Place 1 drop into both eyes at bedtime.    . timolol (TIMOPTIC) 0.5 % ophthalmic solution Apply to eye.    . tiotropium (SPIRIVA HANDIHALER) 18 MCG inhalation capsule      No current facility-administered medications for this visit.    OBJECTIVE: Filed Vitals:   11/08/15 1604  BP: 111/66  Pulse: 105  Temp: 98.1 F (36.7 C)     Body mass index is 14.41 kg/(m^2).    ECOG FS:0 - Asymptomatic  General: Thin, no acute distress. Eyes: Pink conjunctiva, anicteric sclera. HEENT: Normocephalic, moist mucous membranes, clear oropharnyx. Lungs: Clear to auscultation bilaterally. Heart: Regular rate and rhythm. No rubs, murmurs, or gallops. Abdomen: Soft, nontender, nondistended. No organomegaly noted, normoactive bowel sounds. Musculoskeletal: No edema, cyanosis,  or clubbing. Neuro: Alert, answering all questions appropriately. Cranial nerves grossly intact. Skin: No rashes or petechiae noted. Psych: Normal affect. Lymphatics: No cervical, calvicular, axillary or inguinal LAD.   LAB RESULTS:  Lab Results  Component Value Date   NA 138 12/27/2014   K 4.4 12/27/2014   CL 99* 12/27/2014   CO2  34* 12/27/2014   GLUCOSE 89 12/27/2014   BUN 13 12/27/2014   CREATININE 0.61 12/27/2014   CALCIUM 8.6* 12/27/2014   PROT 5.4* 12/22/2014   ALBUMIN 1.8* 12/22/2014   AST 27 12/22/2014   ALT 21 12/22/2014   ALKPHOS 72 12/22/2014   BILITOT 0.2* 12/22/2014   GFRNONAA >60 12/27/2014   GFRAA >60 12/27/2014    Lab Results  Component Value Date   WBC 9.9 12/26/2014   NEUTROABS 14.3* 12/20/2014   HGB 10.1* 12/26/2014   HCT 31.0* 12/26/2014   MCV 87.1 12/26/2014   PLT 372 12/26/2014     STUDIES: Ct Chest W Contrast  10/28/2015  CLINICAL DATA:  Lung cancer with prior resection bilaterally. COPD. Cholecystectomy. Weight loss. Prior right upper and left lower lobectomies. EXAM: CT CHEST WITH CONTRAST TECHNIQUE: Multidetector CT imaging of the chest was performed during intravenous contrast administration. CONTRAST:  75 cc of Isovue-300 COMPARISON:  03/14/2015 FINDINGS: Mediastinum/Nodes: Mediastinal shift to the left. Normal heart size, without pericardial effusion. Multivessel coronary artery atherosclerosis. No central pulmonary embolism, on this non-dedicated study. Right paratracheal node measures 7 mm on image 19/series 2 versus 5 mm on the prior. No hilar adenopathy. Development of prevascular adenopathy, including at 12 mm on image 22/series 2. Compare maximally 5 mm on the prior. retrocrural adenopathy at 9 mm on image 57/series 2 versus 7 mm previously. Lungs/Pleura: Trace left-sided pleural fluid with minimal loculation, similar. Slight increase in left-sided pleural thickening. Volume loss in the left hemi thorax. Right upper and left lower lobectomies. Biapical pleural-parenchymal scarring. Peribronchovascular ill-defined nodularity in the right lower lobe including on image 129/series 3, new. Left apical consolidation with cylindrical bronchiectasis again identified. Soft tissue thickening within a dominant bulla or bleb, including on image 63/series 3. This is new. Upper abdomen:  Cholecystectomy. Normal imaged portions of the liver, spleen, stomach, pancreas, adrenal glands, right kidney. Left renal cyst. 10 mm left periaortic node on image 63/series 2 measured 8 mm previously. Musculoskeletal: No acute osseous abnormality. IMPRESSION: 1. soft tissue thickening within pre-existing bulla/blebs. Suspicious for superinfection upon scarring. A neoplastic process cannot be excluded given the complex clinical history. Bronchoscopy versus antibiotic therapy and repeat imaging should be considered. 2. Development of thoracic and upper abdominal adenopathy. Especially the thoracic adenopathy could be reactive, given the probable infectious process. Metastatic disease or even a developing lymphoproliferative process could have a similar appearance. 3. Right lower lobe peribronchovascular nodularity is new and likely due to infection, including atypical etiologies. 4.  Atherosclerosis, including within the coronary arteries. 5. Extensive surgical changes within both lungs. Electronically Signed   By: Abigail Miyamoto M.D.   On: 10/28/2015 11:13    ASSESSMENT: History of bilateral synchronous primaries both stage IA. Now a CT scan concerning for recurrence.  PLAN:    1. Lung cancer: Patient's initial diagnosis was in 2010 and his second lung surgery occurred on January 19, 2009. Patient was discharged from clinic in October 2015, but now returns with concerning findings on a CT scan. CT scan results reviewed independently and reported as above. There is also concern that this could be an infectious process and patient is currently  on antibiotics. Plan to repeat CT scan in 2 months to assess for interval change. If there is no resolution, will consider PET scan and/or biopsy at that time. No further intervention is needed.  Approximately 30 minutes was spent in discussion of which greater than 50% was consultation.  Patient expressed understanding and was in agreement with this plan. He also  understands that He can call clinic at any time with any questions, concerns, or complaints.    Lloyd Huger, MD   11/15/2015 8:51 AM

## 2015-11-18 ENCOUNTER — Encounter: Payer: Self-pay | Admitting: Dietician

## 2015-11-18 ENCOUNTER — Encounter: Payer: Medicare Other | Attending: Family Medicine | Admitting: Dietician

## 2015-11-18 VITALS — Ht 64.0 in | Wt 82.8 lb

## 2015-11-18 DIAGNOSIS — Z713 Dietary counseling and surveillance: Secondary | ICD-10-CM | POA: Insufficient documentation

## 2015-11-18 DIAGNOSIS — R636 Underweight: Secondary | ICD-10-CM | POA: Diagnosis present

## 2015-11-18 DIAGNOSIS — R634 Abnormal weight loss: Secondary | ICD-10-CM | POA: Diagnosis present

## 2015-11-18 DIAGNOSIS — E785 Hyperlipidemia, unspecified: Secondary | ICD-10-CM | POA: Insufficient documentation

## 2015-11-18 DIAGNOSIS — Z85118 Personal history of other malignant neoplasm of bronchus and lung: Secondary | ICD-10-CM | POA: Insufficient documentation

## 2015-11-18 NOTE — Patient Instructions (Addendum)
   Make sure to eat something every 2-4 hours during the day. Wait 2 hours between snacks and meals to allow time for your hunger/ appetite to build up.   Try peanut butter in some foods like milkshakes, or for snacks.   Include a sandwich or other lunch meal daily.   Try drinking a Boost (try a boost plus-- or add extra dry milk or protein powder) drink nightly at bedtime.
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
## 2015-12-13 ENCOUNTER — Ambulatory Visit
Admission: RE | Admit: 2015-12-13 | Discharge: 2015-12-13 | Disposition: A | Discharge status: Home / Self Care | Payer: Medicare Other | Source: Ambulatory Visit | Attending: Oncology | Admitting: Oncology

## 2015-12-13 ENCOUNTER — Other Ambulatory Visit
Admission: RE | Admit: 2015-12-13 | Discharge: 2015-12-13 | Disposition: A | Discharge status: Home / Self Care | Payer: Medicare Other | Source: Ambulatory Visit | Attending: Internal Medicine | Admitting: Internal Medicine

## 2015-12-13 ENCOUNTER — Inpatient Hospital Stay: Payer: Medicare Other | Attending: Oncology | Admitting: Oncology

## 2015-12-13 ENCOUNTER — Other Ambulatory Visit: Payer: Self-pay | Admitting: *Deleted

## 2015-12-13 VITALS — BP 125/76 | HR 96 | Temp 96.7°F | Resp 18 | Wt 82.0 lb

## 2015-12-13 DIAGNOSIS — Z79899 Other long term (current) drug therapy: Secondary | ICD-10-CM | POA: Insufficient documentation

## 2015-12-13 DIAGNOSIS — D649 Anemia, unspecified: Secondary | ICD-10-CM

## 2015-12-13 DIAGNOSIS — R918 Other nonspecific abnormal finding of lung field: Secondary | ICD-10-CM

## 2015-12-13 DIAGNOSIS — R911 Solitary pulmonary nodule: Secondary | ICD-10-CM | POA: Insufficient documentation

## 2015-12-13 DIAGNOSIS — Z85118 Personal history of other malignant neoplasm of bronchus and lung: Secondary | ICD-10-CM | POA: Insufficient documentation

## 2015-12-13 DIAGNOSIS — J984 Other disorders of lung: Secondary | ICD-10-CM | POA: Insufficient documentation

## 2015-12-13 DIAGNOSIS — Z87891 Personal history of nicotine dependence: Secondary | ICD-10-CM | POA: Insufficient documentation

## 2015-12-13 DIAGNOSIS — C3490 Malignant neoplasm of unspecified part of unspecified bronchus or lung: Secondary | ICD-10-CM

## 2015-12-13 LAB — CREATININE, SERUM: CREATININE: 0.53 mg/dL — AB (ref 0.61–1.24)

## 2015-12-13 MED ORDER — IOPAMIDOL (ISOVUE-300) INJECTION 61%
75.0000 mL | Freq: Once | INTRAVENOUS | Status: AC | PRN
  Administered 2015-12-13: 60 mL via INTRAVENOUS

## 2015-12-13 NOTE — Progress Notes (Signed)
Offers no complaints  

## 2015-12-20 DIAGNOSIS — R918 Other nonspecific abnormal finding of lung field: Secondary | ICD-10-CM | POA: Insufficient documentation

## 2015-12-20 NOTE — Progress Notes (Signed)
[facility]  Telephone:([phone] [phone]) [phone]  ID: [name] OB: 1946/03/03  MR#: [id number]  KVQ#:[id number]  Patient Care Team: Sherrin Daisy, MD as PCP - General (Family Medicine)  CHIEF COMPLAINT: History of bilateral synchronous primaries both stage IA, now with right lower lobe lung mass.  INTERVAL HISTORY: Patient returns to clinic today for further evaluation and discussion of his imaging results. He currently feels well and is asymptomatic. His cough and shortness of breath has resolved. He has no neurologic complaints. He denies any fevers. He has no chest pain. He has a fair appetite, but denies any weight loss. He has no nausea, vomiting, constipation, or diarrhea. He has no urinary complaints. Patient offers no specific complaints today.  REVIEW OF SYSTEMS:   Review of Systems  Constitutional: Negative.  Negative for fever, malaise/fatigue and weight loss.  Respiratory: Negative.  Negative for cough, hemoptysis and shortness of breath.   Cardiovascular: Negative.  Negative for chest pain.  Gastrointestinal: Negative.  Negative for abdominal pain.  Genitourinary: Negative.   Musculoskeletal: Negative.   Neurological: Negative.  Negative for weakness.  Psychiatric/Behavioral: Negative.     As per HPI. Otherwise, a complete review of systems is negatve.  PAST MEDICAL HISTORY: Past Medical History:  Diagnosis Date  . Cancer ([place])    Lung Cancer    PAST SURGICAL HISTORY: Past Surgical History:  Procedure Laterality Date  . CHOLECYSTECTOMY    . KNEE SURGERY Left   . LUNG REMOVAL, PARTIAL Bilateral     FAMILY HISTORY: Reviewed and unchanged. No reported history of malignancy or chronic disease.     ADVANCED DIRECTIVES:    HEALTH MAINTENANCE: Social History  Substance Use Topics  . Smoking status: Former Research scientist (life sciences)  . Smokeless tobacco: Not on file  . Alcohol use Yes     Comment: occasionally      Colonoscopy:  PAP:  Bone density:  Lipid panel:  No Known Allergies  Current Outpatient Prescriptions  Medication Sig Dispense Refill  . budesonide-formoterol (SYMBICORT) 160-4.5 MCG/ACT inhaler Inhale into the lungs.    . cetirizine (ZYRTEC) 10 MG tablet Take by mouth.    . ferrous sulfate 325 (65 FE) MG tablet Take 325 mg by mouth daily with breakfast.    . guaiFENesin-codeine (ROBITUSSIN AC) 100-10 MG/5ML syrup Take 5 mLs by mouth at bedtime as needed.     . mometasone-formoterol (DULERA) 200-5 MCG/ACT AERO Inhale 2 puffs into the lungs 2 (two) times daily. 1 Inhaler 1  . rosuvastatin (CRESTOR) 5 MG tablet Take by mouth.    . timolol (BETIMOL) 0.5 % ophthalmic solution Place 1 drop into both eyes at bedtime. Reported on 11/18/2015    . timolol (TIMOPTIC) 0.5 % ophthalmic solution Apply to eye. Reported on 11/18/2015    . tiotropium (SPIRIVA HANDIHALER) 18 MCG inhalation capsule     . Travoprost, BAK Free, (TRAVATAN) 0.004 % SOLN ophthalmic solution Place 1 drop into both eyes at bedtime.     No current facility-administered medications for this visit.     OBJECTIVE: Vitals:   12/13/15 1336  BP: 125/76  Pulse: 96  Resp: 18  Temp: (!) 96.7 F (35.9 C)     Body mass index is 14.08 kg/m.    ECOG FS:0 - Asymptomatic  General: Thin, no acute distress. Eyes: Pink conjunctiva, anicteric sclera. HEENT: Normocephalic, moist mucous membranes, clear oropharnyx. Lungs: Clear to auscultation bilaterally. Heart: Regular rate and rhythm. No rubs, murmurs, or gallops. Abdomen: Soft, nontender, nondistended. No organomegaly  noted, normoactive bowel sounds. Musculoskeletal: No edema, cyanosis, or clubbing. Neuro: Alert, answering all questions appropriately. Cranial nerves grossly intact. Skin: No rashes or petechiae noted. Psych: Normal affect. Lymphatics: No cervical, calvicular, axillary or inguinal LAD.   LAB RESULTS:  Lab Results  Component Value Date   NA 138 12/27/2014    K 4.4 12/27/2014   CL 99 (L) 12/27/2014   CO2 34 (H) 12/27/2014   GLUCOSE 89 12/27/2014   BUN 13 12/27/2014   CREATININE 0.53 (L) 12/13/2015   CALCIUM 8.6 (L) 12/27/2014   PROT 5.4 (L) 12/22/2014   ALBUMIN 1.8 (L) 12/22/2014   AST 27 12/22/2014   ALT 21 12/22/2014   ALKPHOS 72 12/22/2014   BILITOT 0.2 (L) 12/22/2014   GFRNONAA >60 12/13/2015   GFRAA >60 12/13/2015    Lab Results  Component Value Date   WBC 9.9 12/26/2014   NEUTROABS 14.3 (H) 12/20/2014   HGB 10.1 (L) 12/26/2014   HCT 31.0 (L) 12/26/2014   MCV 87.1 12/26/2014   PLT 372 12/26/2014     STUDIES: Ct Chest W Contrast  Result Date: 12/13/2015 CLINICAL DATA:  Followup lung cancer EXAM: CT CHEST WITH CONTRAST TECHNIQUE: Multidetector CT imaging of the chest was performed during intravenous contrast administration. CONTRAST:  39m ISOVUE-300 IOPAMIDOL (ISOVUE-300) INJECTION 61% COMPARISON:  10/28/2015 FINDINGS: Mediastinum/Lymph Nodes: The heart size appears within normal limits. The trachea appears patent and is midline. Normal appearance of the esophagus. Mediastinal adenopathy is again noted. Index pre-vascular lymph node measures 1 cm, image 21 of series 2. This is compared with 8 mm previously. Adjacent node Measures 1 cm, image 23 of series 2. Previously this measured the same. No axillary adenopathy. No right paratracheal or sub- carinal adenopathy identified. Lungs/Pleura: The patient is status post right upper and left lower lobectomies. There is a small left pleural effusion which appears partially loculated and is unchanged from previous exam. Left apical consolidation with cylindrical bronchiectasis is again identified. Similar appearance of soft tissue thickening with a dominant a bull or bleb, image 65 of series 3. The appearance is unchanged from the previous exam. Area of architectural distortion, scarring and ground-glass attenuation within the right upper lobe appears progressive from previous exam, image 44  of series. There is a new central area of nodularity which measures 1.2 x 0.7 cm, image 46 of series 3. Upper abdomen: The visualized portions of the liver are normal. The visualized portions of the adrenal glands and spleen are negative. There is a large cyst arising from the upper pole the left kidney which measures 5.2 cm, image 59 of series 2. 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 No aggressive lytic or sclerotic bone lesions. IMPRESSION: 1. Similar appearance of the left lung including soft tissue thickening within pre-existing bulla/blebs. As mentioned previously findings may reflect infection superimposed upon scarring versus recurrent neoplasm. 2. Stable appearance of thoracic adenopathy. In the setting of infection findings may be reactive. Metastatic adenopathy is not excluded. 3. Interval progression of architectural distortion, scarring and ground-glass attenuation within the right lower lobe. Within this area there is a new nodular density which has an equivalent diameter of 1 cm. Recommend followup imaging in 3 months. If persistent and/or progressed consider further assessment with PET-CT to assess for recurrent tumor/metastatic disease. Electronically Signed   By: TKerby MoorsM.D.   On: 12/13/2015 13:21    ASSESSMENT: History of bilateral synchronous primaries both stage IA, now with right lower lobe lung mass.  PLAN:  1. Right lower lobe lung mass: Patient's initial diagnosis was in 2010 and his second lung surgery occurred on January 19, 2009. CT scan results from December 13, 2015 reviewed independently and reported as above with mild interval progression of mass in his right lower lobe. No intervention is needed at this time. Will repeat CT scan in 3 months to assess for interval change.  2. Anemia: Mild, monitor.   Patient expressed understanding and was in agreement with this plan. He also understands that He can call clinic at any time with  any questions, concerns, or complaints.    Lloyd Huger, MD   12/20/2015 10:08 PM

## 2016-02-26 DEATH — deceased

## 2016-02-28 ENCOUNTER — Ambulatory Visit: Payer: Medicare Other

## 2016-03-02 ENCOUNTER — Ambulatory Visit: Payer: Medicare Other | Admitting: Oncology
